# Patient Record
Sex: Female | Born: 1955 | Race: White | Hispanic: No | Smoking: Never smoker
Health system: Southern US, Community
[De-identification: ages and names within clinical notes are randomized; demographics above are authoritative.]

## PROBLEM LIST (undated history)

## (undated) DIAGNOSIS — E663 Overweight: Secondary | ICD-10-CM

## (undated) DIAGNOSIS — I10 Essential (primary) hypertension: Secondary | ICD-10-CM

## (undated) DIAGNOSIS — E785 Hyperlipidemia, unspecified: Secondary | ICD-10-CM

## (undated) DIAGNOSIS — M1990 Unspecified osteoarthritis, unspecified site: Secondary | ICD-10-CM

## (undated) DIAGNOSIS — M519 Unspecified thoracic, thoracolumbar and lumbosacral intervertebral disc disorder: Secondary | ICD-10-CM

## (undated) DIAGNOSIS — I7090 Unspecified atherosclerosis: Secondary | ICD-10-CM

## (undated) DIAGNOSIS — I509 Heart failure, unspecified: Secondary | ICD-10-CM

## (undated) DIAGNOSIS — R55 Syncope and collapse: Secondary | ICD-10-CM

## (undated) DIAGNOSIS — K219 Gastro-esophageal reflux disease without esophagitis: Secondary | ICD-10-CM

## (undated) HISTORY — DX: Heart failure, unspecified: I50.9

## (undated) HISTORY — PX: CARDIAC CATHETERIZATION: SHX172

## (undated) HISTORY — PX: ABDOMINAL HYSTERECTOMY: SHX81

---

## 2003-07-16 ENCOUNTER — Emergency Department (HOSPITAL_COMMUNITY): Admission: EM | Admit: 2003-07-16 | Discharge: 2003-07-17 | Payer: Self-pay | Admitting: Emergency Medicine

## 2003-07-17 ENCOUNTER — Encounter: Payer: Self-pay | Admitting: Emergency Medicine

## 2003-07-31 ENCOUNTER — Emergency Department (HOSPITAL_COMMUNITY): Admission: EM | Admit: 2003-07-31 | Discharge: 2003-07-31 | Payer: Self-pay | Admitting: Internal Medicine

## 2003-08-01 ENCOUNTER — Other Ambulatory Visit: Admission: RE | Admit: 2003-08-01 | Discharge: 2003-08-01 | Payer: Self-pay | Admitting: Gynecology

## 2003-09-25 ENCOUNTER — Inpatient Hospital Stay (HOSPITAL_COMMUNITY): Admission: RE | Admit: 2003-09-25 | Discharge: 2003-09-27 | Payer: Self-pay | Admitting: Gynecology

## 2003-09-25 ENCOUNTER — Encounter (INDEPENDENT_AMBULATORY_CARE_PROVIDER_SITE_OTHER): Payer: Self-pay | Admitting: Specialist

## 2003-12-04 ENCOUNTER — Inpatient Hospital Stay (HOSPITAL_COMMUNITY): Admission: EM | Admit: 2003-12-04 | Discharge: 2003-12-07 | Payer: Self-pay | Admitting: Emergency Medicine

## 2004-05-29 ENCOUNTER — Emergency Department (HOSPITAL_COMMUNITY): Admission: EM | Admit: 2004-05-29 | Discharge: 2004-05-29 | Payer: Self-pay | Admitting: Emergency Medicine

## 2005-09-01 ENCOUNTER — Ambulatory Visit: Payer: Self-pay | Admitting: Family Medicine

## 2005-09-04 ENCOUNTER — Ambulatory Visit: Payer: Self-pay | Admitting: Family Medicine

## 2005-09-10 ENCOUNTER — Ambulatory Visit: Payer: Self-pay | Admitting: *Deleted

## 2005-09-10 ENCOUNTER — Ambulatory Visit (HOSPITAL_COMMUNITY): Admission: RE | Admit: 2005-09-10 | Discharge: 2005-09-10 | Payer: Self-pay | Admitting: Family Medicine

## 2006-02-02 ENCOUNTER — Ambulatory Visit: Payer: Self-pay | Admitting: Family Medicine

## 2007-02-12 ENCOUNTER — Inpatient Hospital Stay (HOSPITAL_COMMUNITY): Admission: EM | Admit: 2007-02-12 | Discharge: 2007-02-13 | Payer: Self-pay | Admitting: Emergency Medicine

## 2007-02-21 ENCOUNTER — Ambulatory Visit: Payer: Self-pay | Admitting: Family Medicine

## 2007-04-04 ENCOUNTER — Ambulatory Visit: Payer: Self-pay | Admitting: Family Medicine

## 2008-01-25 ENCOUNTER — Ambulatory Visit: Payer: Self-pay | Admitting: Internal Medicine

## 2008-03-14 ENCOUNTER — Ambulatory Visit: Payer: Self-pay | Admitting: Internal Medicine

## 2008-05-01 ENCOUNTER — Ambulatory Visit: Payer: Self-pay | Admitting: Internal Medicine

## 2008-05-01 ENCOUNTER — Encounter (INDEPENDENT_AMBULATORY_CARE_PROVIDER_SITE_OTHER): Payer: Self-pay | Admitting: Nurse Practitioner

## 2008-05-01 LAB — CONVERTED CEMR LAB
ALT: 32 units/L (ref 0–35)
AST: 24 units/L (ref 0–37)
Albumin: 4.9 g/dL (ref 3.5–5.2)
Alkaline Phosphatase: 83 units/L (ref 39–117)
BUN: 14 mg/dL (ref 6–23)
Basophils Absolute: 0 10*3/uL (ref 0.0–0.1)
Basophils Relative: 0 % (ref 0–1)
CO2: 25 meq/L (ref 19–32)
Calcium: 9.6 mg/dL (ref 8.4–10.5)
Chloride: 100 meq/L (ref 96–112)
Creatinine, Ser: 0.76 mg/dL (ref 0.40–1.20)
Eosinophils Absolute: 0.1 10*3/uL (ref 0.0–0.7)
Eosinophils Relative: 1 % (ref 0–5)
Glucose, Bld: 74 mg/dL (ref 70–99)
HCT: 46.4 % — ABNORMAL HIGH (ref 36.0–46.0)
Hemoglobin: 14.7 g/dL (ref 12.0–15.0)
Lymphocytes Relative: 29 % (ref 12–46)
Lymphs Abs: 2.7 10*3/uL (ref 0.7–4.0)
MCHC: 31.7 g/dL (ref 30.0–36.0)
MCV: 90.3 fL (ref 78.0–100.0)
Monocytes Absolute: 0.7 10*3/uL (ref 0.1–1.0)
Monocytes Relative: 8 % (ref 3–12)
Neutro Abs: 5.9 10*3/uL (ref 1.7–7.7)
Neutrophils Relative %: 62 % (ref 43–77)
Platelets: 256 10*3/uL (ref 150–400)
Potassium: 4.3 meq/L (ref 3.5–5.3)
RBC: 5.14 M/uL — ABNORMAL HIGH (ref 3.87–5.11)
RDW: 13.9 % (ref 11.5–15.5)
Sodium: 138 meq/L (ref 135–145)
Total Bilirubin: 0.5 mg/dL (ref 0.3–1.2)
Total Protein: 8.5 g/dL — ABNORMAL HIGH (ref 6.0–8.3)
WBC: 9.5 10*3/uL (ref 4.0–10.5)

## 2008-05-02 ENCOUNTER — Encounter (INDEPENDENT_AMBULATORY_CARE_PROVIDER_SITE_OTHER): Payer: Self-pay | Admitting: Nurse Practitioner

## 2008-05-04 ENCOUNTER — Ambulatory Visit (HOSPITAL_COMMUNITY): Admission: RE | Admit: 2008-05-04 | Discharge: 2008-05-04 | Payer: Self-pay | Admitting: Family Medicine

## 2008-09-10 ENCOUNTER — Ambulatory Visit: Payer: Self-pay | Admitting: Internal Medicine

## 2008-09-10 LAB — CONVERTED CEMR LAB
BUN: 25 mg/dL — ABNORMAL HIGH (ref 6–23)
CO2: 21 meq/L (ref 19–32)
Calcium: 9.5 mg/dL (ref 8.4–10.5)
Chloride: 104 meq/L (ref 96–112)
Creatinine, Ser: 0.82 mg/dL (ref 0.40–1.20)
Glucose, Bld: 88 mg/dL (ref 70–99)
Potassium: 4.1 meq/L (ref 3.5–5.3)
Sed Rate: 11 mm/hr (ref 0–22)
Sodium: 142 meq/L (ref 135–145)

## 2009-03-29 ENCOUNTER — Ambulatory Visit: Payer: Self-pay | Admitting: Family Medicine

## 2009-04-09 ENCOUNTER — Ambulatory Visit (HOSPITAL_COMMUNITY): Admission: RE | Admit: 2009-04-09 | Discharge: 2009-04-09 | Payer: Self-pay | Admitting: Internal Medicine

## 2009-05-04 IMAGING — MG MM DIGITAL SCREENING BILAT
3 series · 3 of 3 positions shown · non-contrast
Comparison: none

DG SCREEN MAMMOGRAM BILATERAL
Bilateral CC and MLO view(s) were taken.

DIGITAL SCREENING MAMMOGRAM WITH CAD:
The breast tissue is heterogeneously dense.  No masses or malignant type calcifications are 
identified.  Compared with prior studies.

[R MLO]
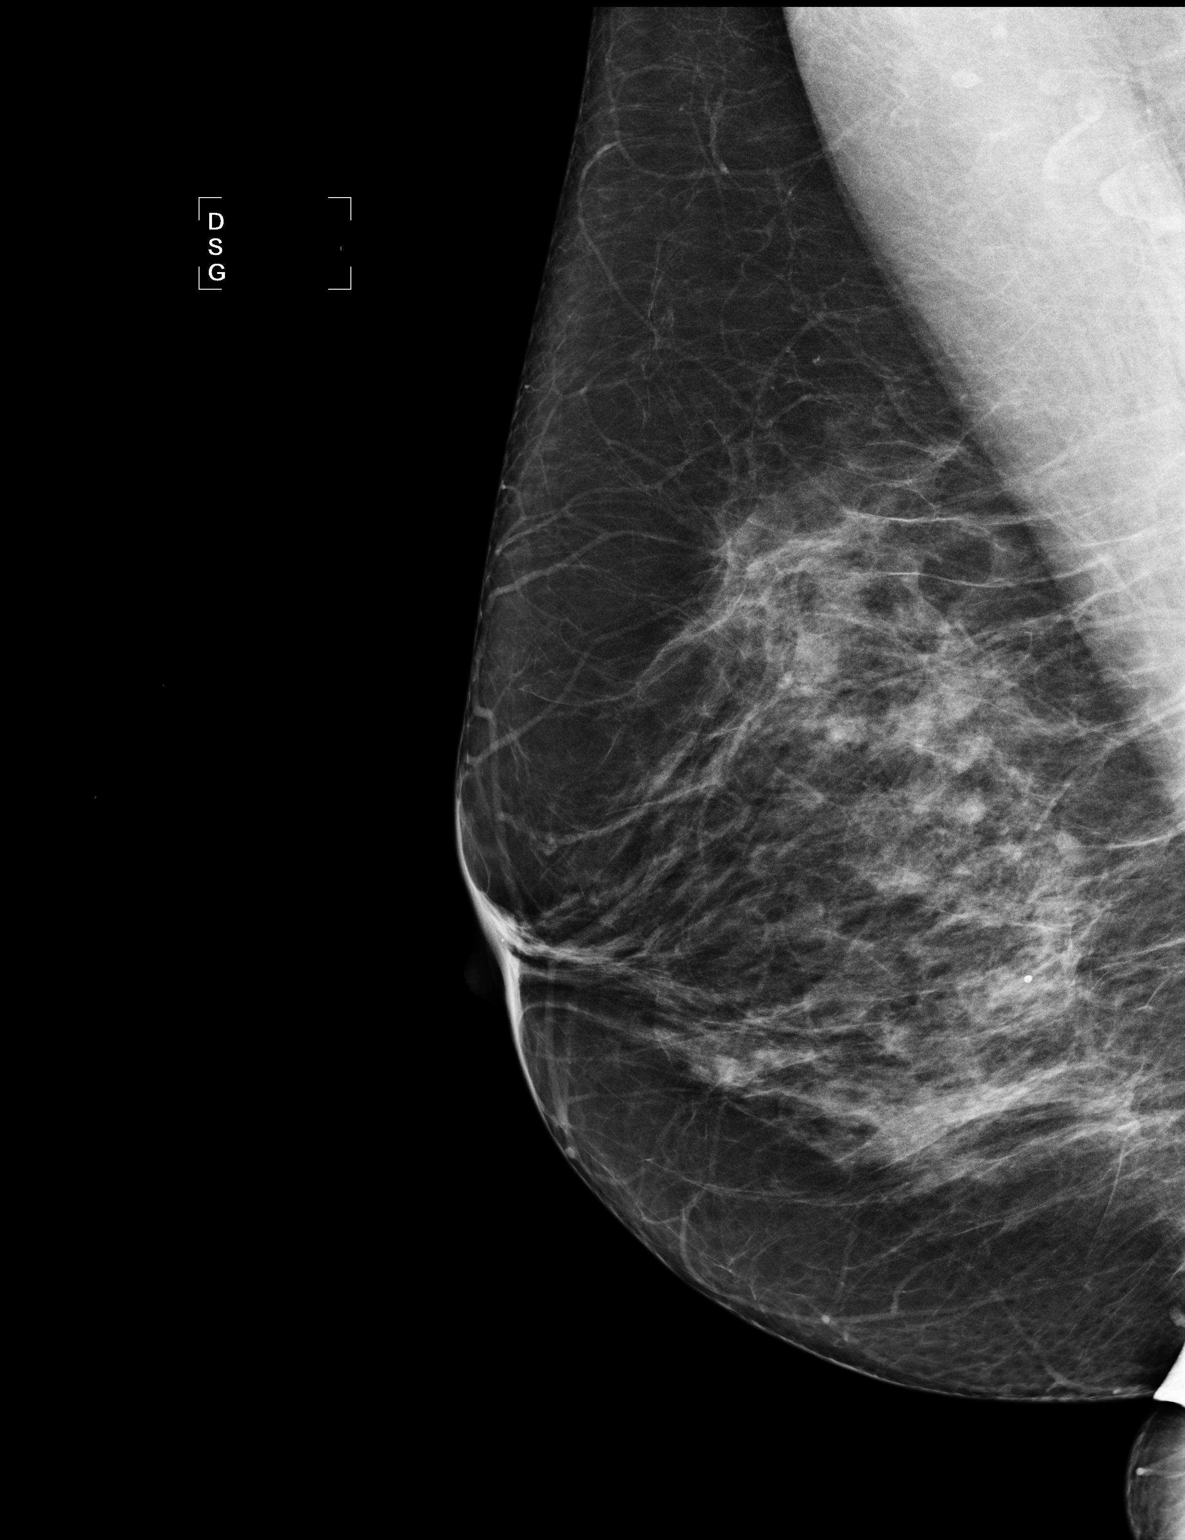

[L CC]
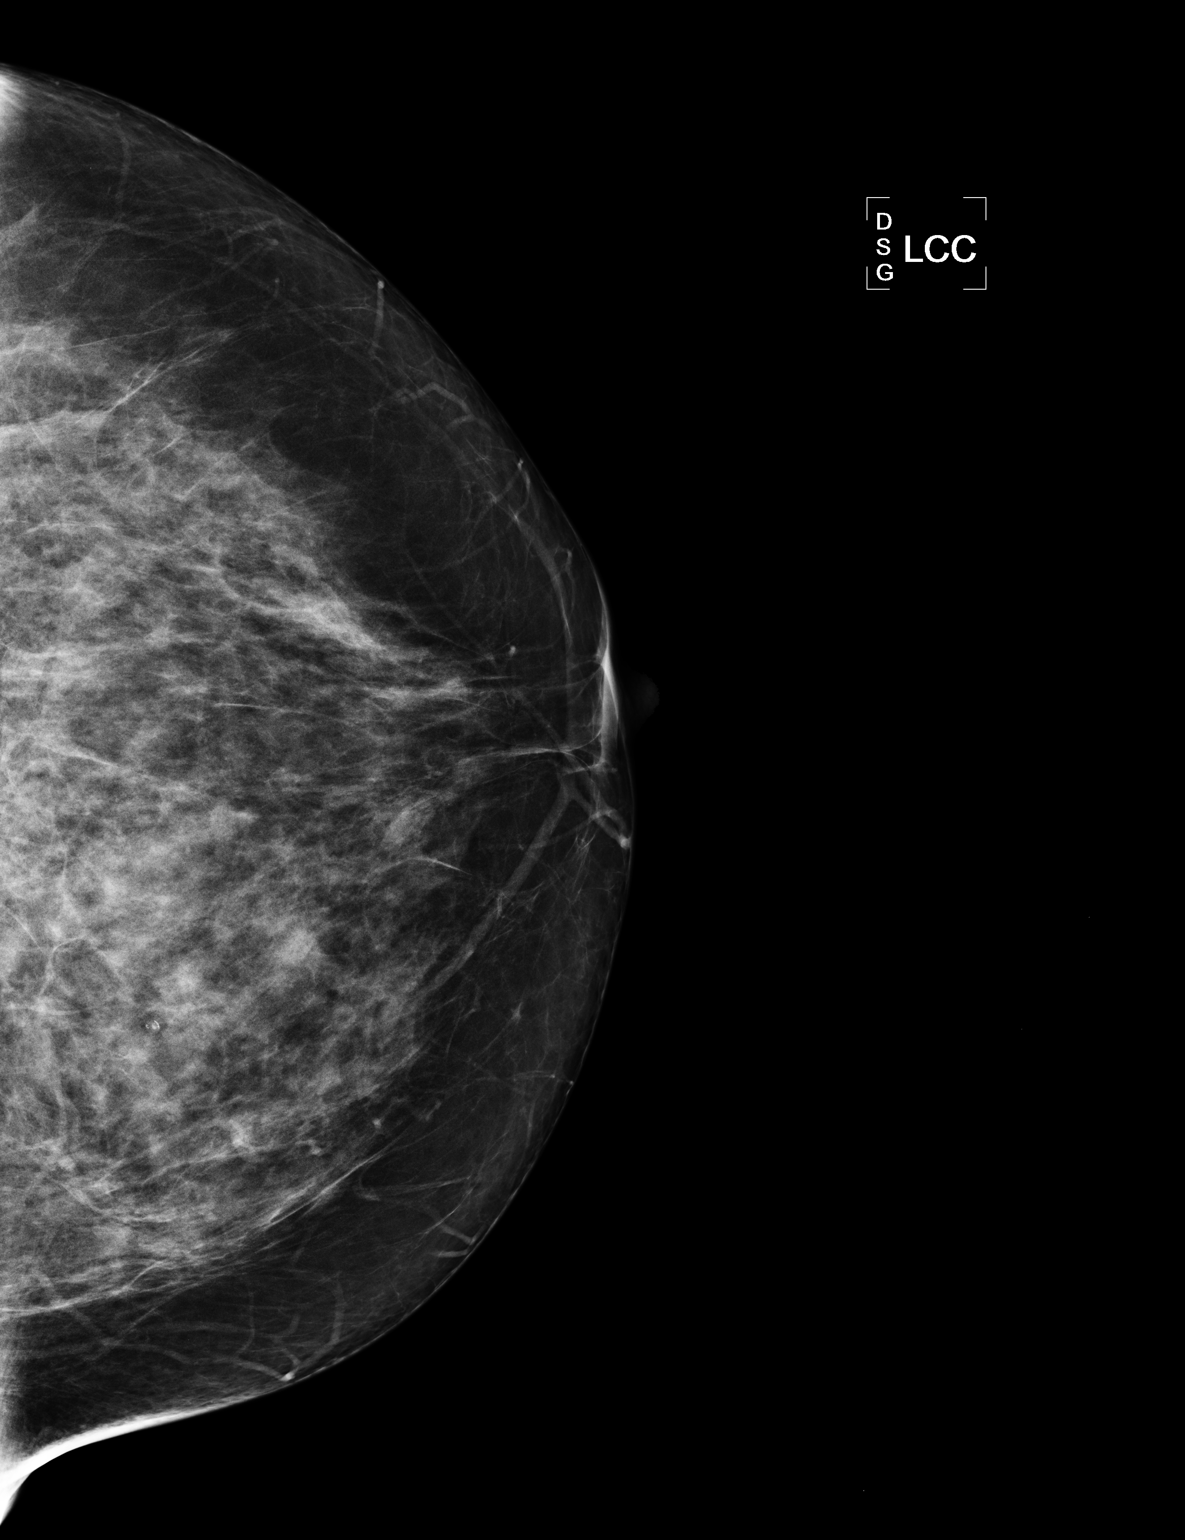

[L MLO]
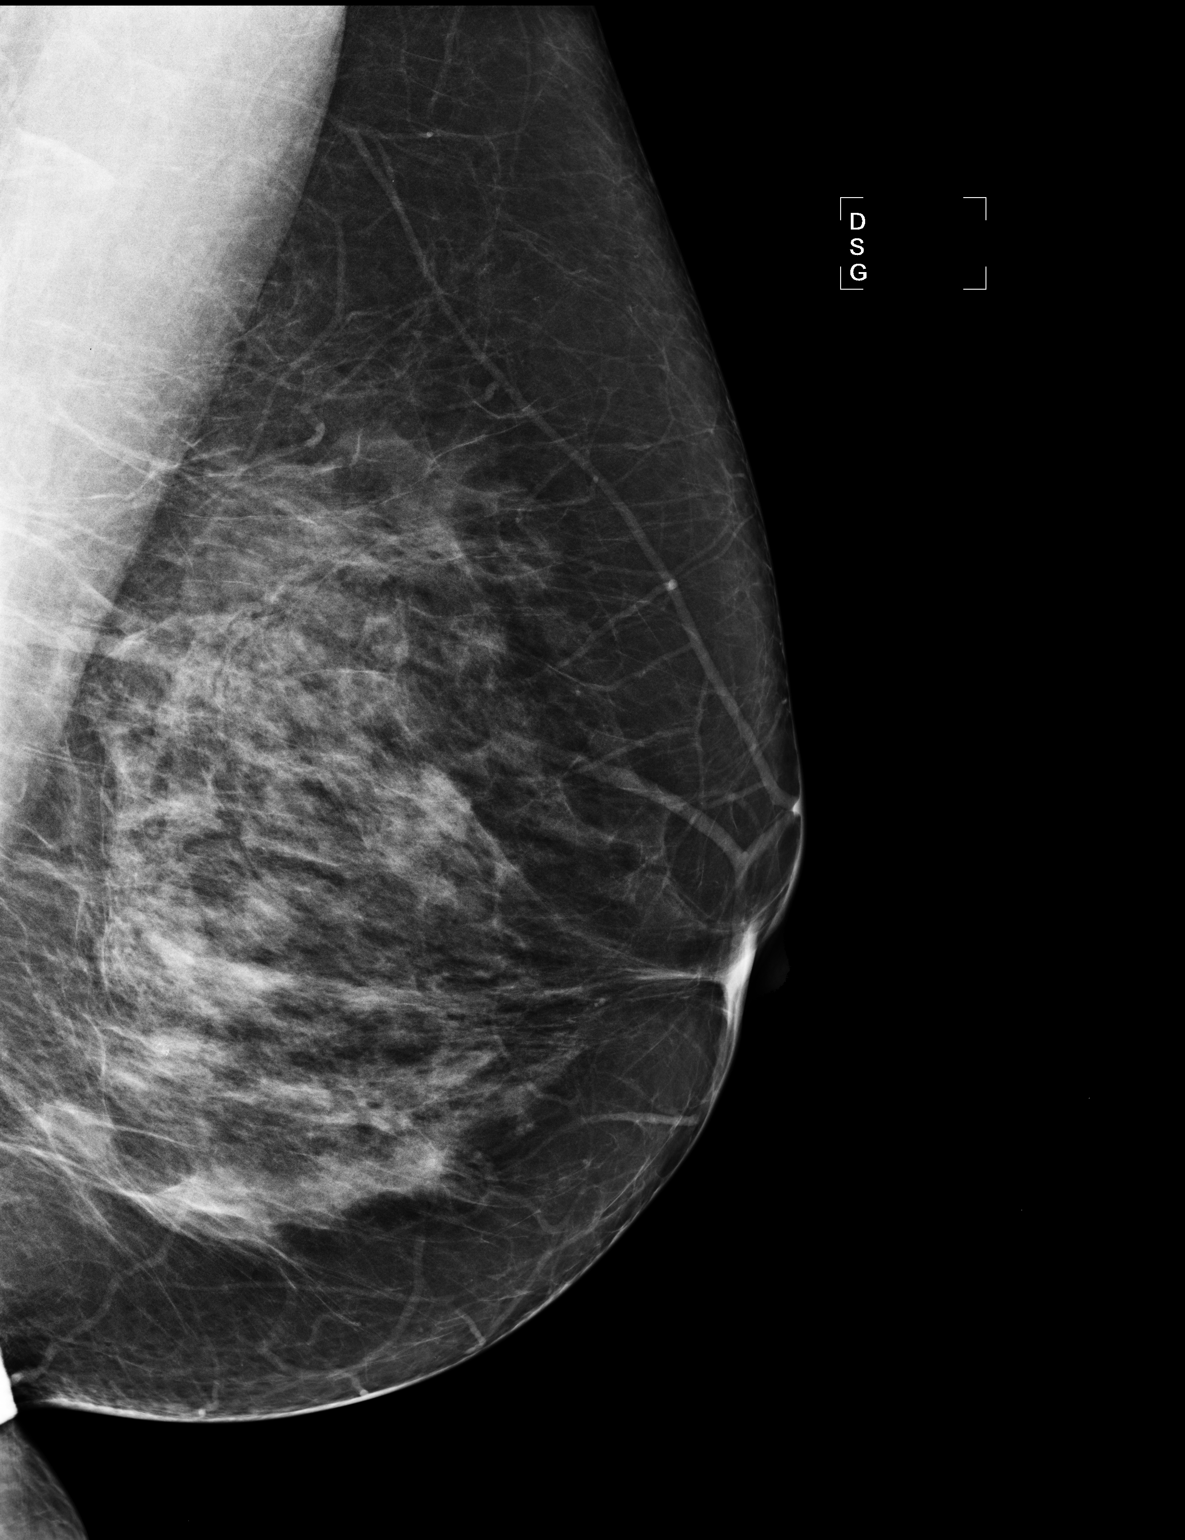

[3 of 3 positions shown; findings below may reference images not displayed]

IMPRESSION: No specific mammographic evidence of malignancy.  Next screening mammogram is recommended in one 
year.

ASSESSMENT: Negative - BI-RADS 1

Screening mammogram in 1 year.
ANALYZED BY COMPUTER AIDED DETECTION. , THIS PROCEDURE WAS A DIGITAL MAMMOGRAM.

## 2009-05-29 ENCOUNTER — Ambulatory Visit: Payer: Self-pay | Admitting: Internal Medicine

## 2009-05-29 LAB — CONVERTED CEMR LAB
ALT: 71 units/L — ABNORMAL HIGH (ref 0–35)
AST: 59 units/L — ABNORMAL HIGH (ref 0–37)
Albumin: 4.6 g/dL (ref 3.5–5.2)
Alkaline Phosphatase: 68 units/L (ref 39–117)
BUN: 16 mg/dL (ref 6–23)
Basophils Absolute: 0 10*3/uL (ref 0.0–0.1)
Basophils Relative: 1 % (ref 0–1)
CO2: 24 meq/L (ref 19–32)
Calcium: 10.1 mg/dL (ref 8.4–10.5)
Chloride: 104 meq/L (ref 96–112)
Creatinine, Ser: 0.87 mg/dL (ref 0.40–1.20)
Eosinophils Absolute: 0.2 10*3/uL (ref 0.0–0.7)
Eosinophils Relative: 3 % (ref 0–5)
Glucose, Bld: 110 mg/dL — ABNORMAL HIGH (ref 70–99)
HCT: 44.5 % (ref 36.0–46.0)
Hemoglobin: 14.2 g/dL (ref 12.0–15.0)
Lymphocytes Relative: 43 % (ref 12–46)
Lymphs Abs: 2.5 10*3/uL (ref 0.7–4.0)
MCHC: 31.9 g/dL (ref 30.0–36.0)
MCV: 87.4 fL (ref 78.0–100.0)
Monocytes Absolute: 0.4 10*3/uL (ref 0.1–1.0)
Monocytes Relative: 7 % (ref 3–12)
Neutro Abs: 2.7 10*3/uL (ref 1.7–7.7)
Neutrophils Relative %: 47 % (ref 43–77)
Platelets: 214 10*3/uL (ref 150–400)
Potassium: 4.6 meq/L (ref 3.5–5.3)
RBC: 5.09 M/uL (ref 3.87–5.11)
RDW: 13.3 % (ref 11.5–15.5)
Sodium: 139 meq/L (ref 135–145)
TSH: 4.982 microintl units/mL — ABNORMAL HIGH (ref 0.350–4.500)
Total Bilirubin: 0.5 mg/dL (ref 0.3–1.2)
Total Protein: 7.7 g/dL (ref 6.0–8.3)
Vit D, 25-Hydroxy: 29 ng/mL — ABNORMAL LOW (ref 30–89)
WBC: 5.8 10*3/uL (ref 4.0–10.5)

## 2009-06-21 ENCOUNTER — Ambulatory Visit: Payer: Self-pay | Admitting: Internal Medicine

## 2009-10-01 ENCOUNTER — Encounter (INDEPENDENT_AMBULATORY_CARE_PROVIDER_SITE_OTHER): Payer: Self-pay | Admitting: Adult Health

## 2009-10-01 ENCOUNTER — Ambulatory Visit: Payer: Self-pay | Admitting: Family Medicine

## 2009-10-01 LAB — CONVERTED CEMR LAB
ALT: 66 units/L — ABNORMAL HIGH (ref 0–35)
AST: 63 units/L — ABNORMAL HIGH (ref 0–37)
Albumin: 4.5 g/dL (ref 3.5–5.2)
Alkaline Phosphatase: 69 units/L (ref 39–117)
Amphetamine Screen, Ur: NEGATIVE
BUN: 17 mg/dL (ref 6–23)
Barbiturate Quant, Ur: NEGATIVE
Benzodiazepines.: NEGATIVE
CO2: 25 meq/L (ref 19–32)
Calcium: 9.6 mg/dL (ref 8.4–10.5)
Chloride: 106 meq/L (ref 96–112)
Cocaine Metabolites: NEGATIVE
Creatinine, Ser: 0.78 mg/dL (ref 0.40–1.20)
Creatinine,U: 127.4 mg/dL
Glucose, Bld: 91 mg/dL (ref 70–99)
Marijuana Metabolite: NEGATIVE
Methadone: NEGATIVE
Microalb, Ur: 0.97 mg/dL (ref 0.00–1.89)
Opiate Screen, Urine: NEGATIVE
Phencyclidine (PCP): NEGATIVE
Potassium: 4 meq/L (ref 3.5–5.3)
Propoxyphene: NEGATIVE
Sodium: 142 meq/L (ref 135–145)
T3 Uptake Ratio: 29.1 % (ref 22.5–37.0)
T4, Total: 6.9 ug/dL (ref 5.0–12.5)
TSH: 3.04 microintl units/mL (ref 0.350–4.500)
Total Bilirubin: 0.3 mg/dL (ref 0.3–1.2)
Total Protein: 7.2 g/dL (ref 6.0–8.3)

## 2009-10-03 ENCOUNTER — Ambulatory Visit (HOSPITAL_COMMUNITY): Admission: RE | Admit: 2009-10-03 | Discharge: 2009-10-03 | Payer: Self-pay | Admitting: Internal Medicine

## 2009-10-03 ENCOUNTER — Encounter (INDEPENDENT_AMBULATORY_CARE_PROVIDER_SITE_OTHER): Payer: Self-pay | Admitting: Adult Health

## 2009-10-03 LAB — CONVERTED CEMR LAB
HCV Ab: NEGATIVE
Hep A IgM: NEGATIVE
Hep B C IgM: NEGATIVE
Hepatitis B Surface Ag: NEGATIVE

## 2009-10-16 ENCOUNTER — Encounter: Admission: RE | Admit: 2009-10-16 | Discharge: 2009-11-13 | Payer: Self-pay | Admitting: Orthopedic Surgery

## 2009-10-22 ENCOUNTER — Ambulatory Visit: Payer: Self-pay | Admitting: Internal Medicine

## 2009-10-22 ENCOUNTER — Encounter (INDEPENDENT_AMBULATORY_CARE_PROVIDER_SITE_OTHER): Payer: Self-pay | Admitting: Adult Health

## 2009-10-22 LAB — CONVERTED CEMR LAB
Cholesterol: 183 mg/dL (ref 0–200)
HDL: 43 mg/dL (ref >39)
LDL Cholesterol: 118 mg/dL — ABNORMAL HIGH (ref 0–99)
Total CHOL/HDL Ratio: 4.3
Triglycerides: 111 mg/dL (ref <150)
VLDL: 22 mg/dL (ref 0–40)

## 2009-12-31 ENCOUNTER — Ambulatory Visit: Payer: Self-pay | Admitting: Internal Medicine

## 2009-12-31 ENCOUNTER — Encounter (INDEPENDENT_AMBULATORY_CARE_PROVIDER_SITE_OTHER): Payer: Self-pay | Admitting: Adult Health

## 2009-12-31 LAB — CONVERTED CEMR LAB
ALT: 53 units/L — ABNORMAL HIGH (ref 0–35)
AST: 44 units/L — ABNORMAL HIGH (ref 0–37)
Albumin: 4.5 g/dL (ref 3.5–5.2)
Alkaline Phosphatase: 80 units/L (ref 39–117)
BUN: 9 mg/dL (ref 6–23)
CO2: 28 meq/L (ref 19–32)
Calcium: 9.4 mg/dL (ref 8.4–10.5)
Chloride: 104 meq/L (ref 96–112)
Cholesterol: 154 mg/dL (ref 0–200)
Creatinine, Ser: 0.77 mg/dL (ref 0.40–1.20)
Glucose, Bld: 104 mg/dL — ABNORMAL HIGH (ref 70–99)
HDL: 41 mg/dL (ref >39)
LDL Cholesterol: 91 mg/dL (ref 0–99)
Potassium: 4.7 meq/L (ref 3.5–5.3)
Sodium: 142 meq/L (ref 135–145)
Total Bilirubin: 0.3 mg/dL (ref 0.3–1.2)
Total CHOL/HDL Ratio: 3.8
Total Protein: 7.3 g/dL (ref 6.0–8.3)
Triglycerides: 112 mg/dL (ref <150)
VLDL: 22 mg/dL (ref 0–40)

## 2010-04-17 ENCOUNTER — Ambulatory Visit: Payer: Self-pay | Admitting: Internal Medicine

## 2010-06-19 ENCOUNTER — Encounter (INDEPENDENT_AMBULATORY_CARE_PROVIDER_SITE_OTHER): Payer: Self-pay | Admitting: Adult Health

## 2010-06-19 ENCOUNTER — Ambulatory Visit: Payer: Self-pay | Admitting: Internal Medicine

## 2010-06-19 LAB — CONVERTED CEMR LAB
ALT: 20 units/L (ref 0–35)
AST: 29 units/L (ref 0–37)
Albumin: 4.6 g/dL (ref 3.5–5.2)
Alkaline Phosphatase: 61 units/L (ref 39–117)
BUN: 13 mg/dL (ref 6–23)
CO2: 26 meq/L (ref 19–32)
Calcium: 9.8 mg/dL (ref 8.4–10.5)
Chloride: 104 meq/L (ref 96–112)
Cholesterol: 102 mg/dL (ref 0–200)
Creatinine, Ser: 0.91 mg/dL (ref 0.40–1.20)
Glucose, Bld: 109 mg/dL — ABNORMAL HIGH (ref 70–99)
HDL: 47 mg/dL (ref >39)
LDL Cholesterol: 44 mg/dL (ref 0–99)
Potassium: 4.4 meq/L (ref 3.5–5.3)
Sodium: 139 meq/L (ref 135–145)
Total Bilirubin: 0.4 mg/dL (ref 0.3–1.2)
Total CHOL/HDL Ratio: 2.2
Total Protein: 7.2 g/dL (ref 6.0–8.3)
Triglycerides: 55 mg/dL (ref <150)
VLDL: 11 mg/dL (ref 0–40)

## 2010-08-04 ENCOUNTER — Ambulatory Visit: Payer: Self-pay | Admitting: Family Medicine

## 2011-03-04 ENCOUNTER — Encounter (INDEPENDENT_AMBULATORY_CARE_PROVIDER_SITE_OTHER): Payer: Self-pay | Admitting: *Deleted

## 2011-03-04 LAB — CONVERTED CEMR LAB
ALT: 60 units/L — ABNORMAL HIGH (ref 0–35)
AST: 59 units/L — ABNORMAL HIGH (ref 0–37)
Albumin: 5.1 g/dL (ref 3.5–5.2)
Alkaline Phosphatase: 79 units/L (ref 39–117)
BUN: 13 mg/dL (ref 6–23)
Basophils Absolute: 0 10*3/uL (ref 0.0–0.1)
Basophils Relative: 0 % (ref 0–1)
CO2: 25 meq/L (ref 19–32)
Calcium: 10.2 mg/dL (ref 8.4–10.5)
Chlamydia, DNA Probe: NEGATIVE
Chloride: 100 meq/L (ref 96–112)
Creatinine, Ser: 0.81 mg/dL (ref 0.40–1.20)
Eosinophils Absolute: 0.4 10*3/uL (ref 0.0–0.7)
Eosinophils Relative: 4 % (ref 0–5)
GC Probe Amp, Genital: NEGATIVE
Glucose, Bld: 92 mg/dL (ref 70–99)
HCT: 45.7 % (ref 36.0–46.0)
Hemoglobin: 14.8 g/dL (ref 12.0–15.0)
Lymphocytes Relative: 40 % (ref 12–46)
Lymphs Abs: 4.1 10*3/uL — ABNORMAL HIGH (ref 0.7–4.0)
MCHC: 32.4 g/dL (ref 30.0–36.0)
MCV: 87.4 fL (ref 78.0–100.0)
Monocytes Absolute: 0.9 10*3/uL (ref 0.1–1.0)
Monocytes Relative: 8 % (ref 3–12)
Neutro Abs: 4.8 10*3/uL (ref 1.7–7.7)
Neutrophils Relative %: 47 % (ref 43–77)
Platelets: 223 10*3/uL (ref 150–400)
Potassium: 4.2 meq/L (ref 3.5–5.3)
RBC: 5.23 M/uL — ABNORMAL HIGH (ref 3.87–5.11)
RDW: 13.9 % (ref 11.5–15.5)
Sodium: 138 meq/L (ref 135–145)
Total Bilirubin: 0.5 mg/dL (ref 0.3–1.2)
Total Protein: 8.4 g/dL — ABNORMAL HIGH (ref 6.0–8.3)
WBC: 10.2 10*3/uL (ref 4.0–10.5)

## 2011-03-10 ENCOUNTER — Other Ambulatory Visit (HOSPITAL_COMMUNITY): Payer: Self-pay | Admitting: Family Medicine

## 2011-03-10 DIAGNOSIS — R1032 Left lower quadrant pain: Secondary | ICD-10-CM

## 2011-03-11 ENCOUNTER — Encounter (INDEPENDENT_AMBULATORY_CARE_PROVIDER_SITE_OTHER): Payer: Self-pay | Admitting: *Deleted

## 2011-03-11 LAB — CONVERTED CEMR LAB
Cholesterol: 128 mg/dL (ref 0–200)
HDL: 42 mg/dL (ref >39)
LDL Cholesterol: 69 mg/dL (ref 0–99)
Total CHOL/HDL Ratio: 3
Triglycerides: 85 mg/dL (ref <150)
VLDL: 17 mg/dL (ref 0–40)

## 2011-03-13 ENCOUNTER — Ambulatory Visit (HOSPITAL_COMMUNITY)
Admission: RE | Admit: 2011-03-13 | Discharge: 2011-03-13 | Disposition: A | Discharge status: Home / Self Care | Payer: Medicare Other | Source: Ambulatory Visit | Attending: Family Medicine | Admitting: Family Medicine

## 2011-03-13 DIAGNOSIS — R1032 Left lower quadrant pain: Secondary | ICD-10-CM

## 2011-03-13 DIAGNOSIS — K7689 Other specified diseases of liver: Secondary | ICD-10-CM | POA: Insufficient documentation

## 2011-03-13 DIAGNOSIS — R1011 Right upper quadrant pain: Secondary | ICD-10-CM | POA: Insufficient documentation

## 2011-03-13 MED ORDER — IOHEXOL 300 MG/ML  SOLN
100.0000 mL | Freq: Once | INTRAMUSCULAR | Status: AC | PRN
  Administered 2011-03-13: 100 mL via INTRAVENOUS

## 2011-04-10 ENCOUNTER — Other Ambulatory Visit (HOSPITAL_COMMUNITY): Payer: Self-pay | Admitting: Family Medicine

## 2011-04-10 DIAGNOSIS — Z1231 Encounter for screening mammogram for malignant neoplasm of breast: Secondary | ICD-10-CM

## 2011-04-28 ENCOUNTER — Ambulatory Visit (HOSPITAL_COMMUNITY): Payer: Medicaid Other

## 2011-05-01 NOTE — Discharge Summary (Signed)
   NAME:  Cheryl Chase, Cheryl Chase                            ACCOUNT NO.:  [id number]   MEDICAL RECORD NO.:  [id number]                   PATIENT TYPE:  INP   LOCATION:  9323                                 FACILITY:  [facility]   PHYSICIAN:  Timothy P. Fontaine, M.D.           DATE OF BIRTH:  05-22-1956   DATE OF ADMISSION:  09/25/2003  DATE OF DISCHARGE:  09/27/2003                                 DISCHARGE SUMMARY   DISCHARGE DIAGNOSES:  1. Menorrhagia.  2. Anemia.  3. Leiomyomata.  4. Status post abdominal hysterectomy with bilateral salpingo-oophorectomy     by Dr. Colin Broach on September 25, 2003.   HISTORY:  [age]-year-age female, G4, P4, with a history of tubal  ligation with increasing irregular menses with daily bleeding up to  hemorrhage status.  Uterus was 16-week size and ultrasound revealed multiple  leiomyomas, largest ranging 6-7 mm.  _____ was performed, there were no  gross intracavitary abnormality.  Hemoglobin was 10.  TSH, FSH, and  prolactin all within normal limits.  Was started on high-dose progestin  therapy, however, continued to bleed off-and-on on a daily basis.  Patient  is admitted for definitive surgery.   HOSPITAL COURSE:  On September 25, 2003, the patient was admitted and  underwent a total abdominal hysterectomy and bilateral salpingo-oophorectomy  by Dr. Colin Broach without complication.  Postoperatively patient  remained afebrile, voiding, stable condition.  She was discharged to home on  September 27, 2003, in satisfactory condition, and given [facility]  instructions about discharge planning.  On September 26, 2003, hemoglobin was  8.5 and hematocrit 25.2.   DISCHARGE INSTRUCTIONS:  The patient was discharged to home.  She was to  return to the office in two weeks.  If she has any problems prior to that  contact the office.  She was given a prescription for Tylox, #20, p.r.n.  pain and is to take iron sulfate one p.o. every day.     Susa Loffler, P.A.                    Timothy P. Audie Box, M.D.    Ardath Sax  D:  10/12/2003  T:  10/12/2003  Job:  [id number]

## 2011-05-01 NOTE — H&P (Signed)
Cheryl Chase, Cheryl Chase                  ACCOUNT NO.:  [id number]   MEDICAL RECORD NO.:  [id number]          PATIENT TYPE:  EMS   LOCATION:  MAJO                         FACILITY:  [facility]   PHYSICIAN:  Manning Charity, MD     DATE OF BIRTH:  March 04, 1956   DATE OF ADMISSION:  02/12/2007  DATE OF DISCHARGE:                              HISTORY & PHYSICAL   PRIMARY CARE PHYSICIAN:  Willis Modena, MD   CHIEF COMPLAINT:  Substernal chest pain and syncopal episode.   HISTORY OF PRESENT ILLNESS:  Cheryl Chase is a 55 year old woman with a  history of polysubstance abuse and reported syncopal episodes in 2005 of  an unknown etiology, who presents to the emergency room because of  sudden onset of sharp substernal chest pressure which began around 6  p.m. tonight, per her report.  She says that there was no radiation of  the pain, no associated symptoms or shortness of breath, nausea,  vomiting or diaphoresis.  The pain lasted for about 6 hours and she then  had a syncopal episode which she reports lasted for a few minutes.  Her  sister witnessed this syncopal episode and says that she had tonic-  clonic activity of her feet and hands, although her sister is not  available to get a history from at this time.  The patient denies any  preceding symptoms before the syncopal episode, other than the chest  pain.  She specifically denies any palpitations.  She did not have any  incontinence or tongue-biting during the episode.  She does report that  things seemed a little off when she came out of the episode and it  took a while before everything seemed to return to normal.  Per her  report, she has no history of seizures, strokes or head trauma.   PAST MEDICAL HISTORY:  1. She has had a total abdominal hysterectomy with bilateral salpingo-      oophorectomy for menorrhagia.  2. She has a history of polysubstance abuse with crack cocaine and      alcohol.  3. She has a history of recurrent UTIs as a child.  4. She has a reported history of multiple syncopal episodes, with the      last one being in 2005, although there is no record of this in the      computer chart.   HOME MEDICATIONS:  She takes Advil p.r.n. and denies taking any other  medications.   ALLERGIES:  She has no known drug allergies.   FAMILY HISTORY:  Her mother is deceased from bone cancer.  Father is  deceased from hypertension and an MI at the age of 47.  She has 8  siblings with heart trouble, but she does not know at what age they  started having heart trouble and she does not know the specifics  regarding the heart trouble.   SOCIAL HISTORY:  She is divorced with 4 children, unemployed, formerly  worked at [employer] per her report to me.  She says that she drinks  alcohol once in a while, cannot  remember the last time she had any  alcohol.  She specifically denies any history of alcohol abuse, although  it is written in her chart.  She continues to use snuff regularly and  denies any illegal drug use, although there are records in the computer,  history and physicals, which note cocaine use.   REVIEW OF SYSTEMS:  Positive for dyspnea on exertion, otherwise  negative, except as per the HPI.   PHYSICAL EXAMINATION:  VITAL SIGNS:  Temperature of 98.2, blood pressure  120/76, pulse is 70, respiratory rate is 18 and 96% on room air.  GENERAL:  She was slightly lethargic, in no acute distress.  She appears  older than her stated age.  HEENT:  Atraumatic with very poor dentition and clear mucous membranes.  CARDIOVASCULAR:  Regular rate and rhythm except for an occasional  premature beat.  She had no murmurs, rubs, or gallops.  She had clear,  good air movement.  ABDOMEN:  Soft, protuberant with positive bowel sounds and nontender.  EXTREMITIES:  Without any edema.  NEUROLOGIC:  Cranial nerves II-XII intact.  Sensation was normal.  Strength was 5/5 throughout.  Deep tendon reflexes were 3+, but  symmetric and no  clonus.   LABORATORY DATA:  White count 9.2, hemoglobin 13.3, platelet count  237,000.  BMET showed a sodium of 139, potassium 3.7, chloride 106,  bicarbonate 26, BUN 9, creatinine 0.8, glucose 126.  Cardiac enzymes:  Troponin I initially was less than 0.05 and then elevated to 0.11, CK-MB  was 1.4 to 2.6 and myoglobin was negative x2.   IMAGING:  CT of the chest with contrast was negative for PE; it did show  some atelectasis and fatty liver.   ASSESSMENT:  1. Chest pain.  Symptoms of substernal severe chest pain is of course      concerning for coronary etiology.  Currently, her cardiac risk      factors include possible positive family history and cocaine use as      well as postmenopausal state and age.  Other differential includes      a gastrointestinal course, especially if she is continuing to      drink.  I am specifically concerned about gastritis or esophageal      spasms.  At this time, we will proceed with ruling acute myocardial      infarction with cardiac enzymes and serial EKGs.  If negative, we      will need to decide on Cardiolite versus outpatient      catheterization.  We will monitor on telemetry, especially given      the syncopal episode.  2. Syncopal episode.  Timing with the chest pain is concerning for      hypoxic episode or arrhythmia.  Tonic-clonic activity may represent      a seizure activity related to hypoxia or arrhythmia.  We will      monitor on telemetry, rule out with cardiac enzymes and serial      EKGs.  I am also going to go ahead and check an MRI of the head,      given the fact that she has no known foci for seizures and the      details around the syncopal episode are somewhat unclear.  I am      also going to check an EEG, although postictal EEG is of limited      value usually.  3. History of polysubstance abuse.  We will treat with thiamine  and     folate, monitor for delirium tremens and check an alcohol as well      as a urine drug  screen.  4. Fatty liver.  We will check liver function tests and coagulations.      Manning Charity, MD     KK/MEDQ  D:  02/12/2007  T:  02/12/2007  Job:  [id number]   cc:   Willis Modena MD

## 2011-05-01 NOTE — H&P (Signed)
NAME:  Cheryl Chase, Cheryl Chase                            ACCOUNT NO.:  [id number]   MEDICAL RECORD NO.:  [id number]                   PATIENT TYPE:  INP   LOCATION:  5007                                 FACILITY:  [facility]   PHYSICIAN:  Vania Rea, M.D.              DATE OF BIRTH:  [birth year]   DATE OF ADMISSION:  12/03/2003  DATE OF DISCHARGE:                                HISTORY & PHYSICAL   PRIMARY CARE PHYSICIAN:  Unassigned.   CHIEF COMPLAINT:  Lower abdominal pain for two days.   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 The patient notes  increased thirst but not increased frequency of micturition or dysuria. The  patient had a syncopal episode one day ago related to standing but denies  nausea, vomiting, or diarrhea. No constipation. Has not noticed hematuria.   The patient had noted that patient has worsened with intercourse, and she  has post coital spotting. The patient saw her gynecologist on the day of  admission, and he noted ragged vaginal cuff. There was no note of any  intravaginal foreign body when he examined her. In the emergency room, she  was evaluated by the surgical service. They reviewed her abdominal CT and  confirmed that there was no evidence of acute intra-abdominal pathology,  particularly no evidence of intravaginal fistula. High density material seen  in the vagina is considered to probably a suture line.   PAST MEDICAL HISTORY:  Significant for recurrent bladder and kidney  infections as a teenager. She is status post TAH and BSO September 25, 2003.  Denies history of  kidney stones.   MEDICATIONS:  None.   ALLERGIES:  No known drug allergies.   SOCIAL HISTORY:  She works as a Advertising copywriter. Denies smoking but uses a small  box of snuff daily for over 40 years. Smokes crack cocaine occasionally,  last use two weeks ago. Denies heroin or other illicit drug use. Denies ETOH  abuse as she uses alcohol only very occasionally.   REVIEW OF SYSTEMS:  Not further contributory.   PHYSICAL EXAMINATION:  GENERAL:  Thin and frail looking middle-aged  Caucasian lady lying on the stretcher.  VITAL SIGNS:  Temperature is 100.7, pulse is 70, blood pressure varies  between 96/66 to 110/68, respirations 20. She is saturating 100% on 2  liters.  HEENT:  She is pink and anicteric. Pupils are equal and reactive to light.  CHEST:  Her chest is clear bilaterally. There is no wheezing.  CARDIOVASCULAR:  Cardiovascular system is  regular. There is no murmur.  ABDOMEN:  She has redness in the lower abdominal wall which relates to the  use of a heating pad to relieve the pain. She is diffusely tender in the  suprapubic area. There is mild epigastric tenderness, and she is tender in  the left flank.  EXTREMITIES:  There is no edema and dorsalis pedis is 2+ bilaterally.   LABORATORY DATA:  Hemoglobin 11.4, hematocrit 36.4, white count 14.7, MCV  79.3. Sodium 139, potassium 3.4, BUN 12, and creatinine 1. Her pH is 7.4.  Urine pregnancy test is negative. Urinalysis shows pH of 6, leukocyte  esterase moderate, urine is cloudy, ketones 15, protein is 100, small amount  of blood, white cells 21 to 50, red cells 7 to 10. CT scan of the abdomen as  reported negative for intravaginal fistula, negative for any acute intra-  abdominal pathology.   ASSESSMENT:  Probable left pyelonephritis. Foreign body in the vagina is  unlikely since she had vaginal exam by her gynecologist today, and she also  denies any vaginal discharge. The patient is also dehydrated and is probably  contributory  to syncope.   PLAN:  IV fluids. Add Cipro as antibiotic. Await culture results. I think we  should get an ultrasound of renal collecting system.                                                Vania Rea, M.D.    LC/MEDQ  D:  12/04/2003  T:  12/04/2003  Job:  [id number]

## 2011-05-01 NOTE — Discharge Summary (Signed)
NAMECIRIA, Cheryl Chase                  ACCOUNT NO.:  [id number]   MEDICAL RECORD NO.:  [id number]          PATIENT TYPE:  INP   LOCATION:  3729                         FACILITY:  [facility]   PHYSICIAN:  Wilson Singer, M.D.DATE OF BIRTH:  April 07, 1956   DATE OF ADMISSION:  02/12/2007  DATE OF DISCHARGE:  02/13/2007                               DISCHARGE SUMMARY   FINAL DISCHARGE DIAGNOSES:  1. Musculoskeletal chest pain.  2. Syncopal, probably vasovagal in origin.   CONDITION ON DISCHARGE:  Stable.   MEDICATIONS ON DISCHARGE:  Ibuprofen 800 mg t.i.d. with food for 5 days.   PROCEDURES:  1. Inpatient MRI of the brain which was normal.  2. CT angiogram of the chest which showed some bibasilar dependent      atelectasis, but no evidence of acute pulmonary embolism.  3. Chest x-ray done on February 11, 2007 which showed mild enlargement      of cardiomediastinal silhouette, no edema and bibasilar      atelectasis.   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  The pain  lasted 6 hours and she became lightheaded and lost consciousness for  only about 1 minute.  Apparently she was shaking, lost unconscious, but  there was no tongue biting or urinary incontinence.  Please see initial  History and Physical examination performed by Dr. Lyda Perone.   HOSPITAL PROGRESS:  The patient was admitted to the telemetry floor and  serial cardiac enzymes and electrocardiograms were done which were all  without any changes.  Also an MRI brain scan was done which was  essentially normal.  During hospitalization she was given ibuprofen 800  mg t.i.d. and on the day of discharge the pain is resolved.  On physical  examination she is afebrile.  Blood pressure 100/64, temperature 97.9,  pulse 50.  Pulse ox  99%.  Heart sounds are present and normal.  Lung  fields are clear.  There is no pleural rub.  There is no pericardial  rub.  She is being discharged and I have given her a prescription for  ibuprofen 800 mg t.i.d. with food for the next 5 days.  She should  follow up with her primary care physician in the next week or two.      Wilson Singer, M.D.  Electronically Signed     NCG/MEDQ  D:  02/13/2007  T:  02/13/2007  Job:  [id number]

## 2011-05-01 NOTE — Consult Note (Signed)
NAME:  Cheryl Chase, Cheryl Chase                            ACCOUNT NO.:  [id number]   MEDICAL RECORD NO.:  [id number]                   PATIENT TYPE:  INP   LOCATION:  5007                                 FACILITY:  [facility]   PHYSICIAN:  Adolph Pollack, M.D.            DATE OF BIRTH:  1956-11-06   DATE OF CONSULTATION:  12/03/2003  DATE OF DISCHARGE:                                   CONSULTATION   REASON FOR CONSULTATION:  Lower abdominal pain, possible enterovaginal  fistula.   HISTORY OF PRESENT ILLNESS:  Cheryl Chase is a 55 year old female who was two  months status post TAH/BSO.  Saturday night she began noticing some sharp  suprapubic pain that began to escalate.  She also had some urinary  frequency, urgency and burning with urination.  Sunday night she had sexual  intercourse and had increased pain.  By her report, she presented to her  gynecologist's office and was examined and was noted to have a little bit of  a shaggy vaginal cuff.  Because of increased pain, she presented to the  emergency department.  While in the emergency department, she was evaluated  and noted to have fever of 100.7.  A CT scan of the abdomen and pelvis were  ordered.  At first, there was some question of whether she had contrast in  her vagina and this is the reason I was called.  She has not been passing  any type of enteric material per her vagina.  She had some bloody vaginal  discharge following sexual intercourse yesterday.   PAST MEDICAL HISTORY:  1. Urinary tract infection.  2. Cocaine abuse.  3. Leiomyomata of the uterus.  4. Alcohol abuse.   PAST SURGICAL HISTORY:  1. Bilateral tubal ligation.  2. TAH/BSO.   ALLERGIES:  No known drug allergies.   CURRENT MEDICATIONS:  None.   SOCIAL HISTORY:  She drinks one or two alcoholic beverages a day.  She does  dip snuff.   REVIEW OF SYMPTOMS:  CARDIOVASCULAR:  She denies heart disease or  hypertension.  PULMONARY:  She denies pneumonia, emphysema,  bronchitis, or  tuberculosis.  GI:  She denies any hepatitis, peptic ulcer disease,  diverticulitis or colitis.  SKIN:  She states she put a heating pad on her  lower abdomen and then developed a rash from that.  GU:  As per HPI.  NEUROLOGIC:  No strokes or seizures.  HEMATOLOGIC:  No known bleeding  sources or blood clots.   PHYSICAL EXAMINATION:  GENERAL APPEARANCE:  A thin female who appears  somewhat uncomfortable after waking her up.  VITAL SIGNS:  Her maximum temperature in the emergency department was 100.7,  currently is 99.2.  Blood pressure is 101/62, pulse 63, respiratory rate 18,  saturations 100% on room air.  HEENT:  Eyes:  Extraocular movements intact.  No icterus noted.  NECK:  Supple without palpable masses or  obvious thyroid enlargement.  LUNGS:  Breath sounds are equal and clear.  Respirations unlabored.  CARDIOVASCULAR:  Regular rate and rhythm.  No lower extremity edema noted.  ABDOMEN:  Soft with small subumbilical scarring, a lower transverse scar.  She has active bowel sounds.  She does have tenderness and guarding to  palpation and percussion of suprapubic region.  Also has left flank  tenderness.  No right flank tenderness.  No obvious masses noted in the  upper abdomen.  There is a fullness in the lower abdomen noted.   LABORATORY DATA:  White blood cell count 14,700, hemoglobin 11.4.  Urinalysis demonstrates 21 to 50 white blood cells, 7 to 10 red blood cells,  few bacteria.   CT scan was reviewed with Dr. Pecolia Ades, includes ___________  pelvis cuts.  Where the vaginal suture line appears to be, there is some enhancement.  Contrast goes throughout the entire small-bowel into the colon.  No evidence  of obstruction.  Appendices were normal.  No colitis.  There is one focal  loop of small-bowel that is slightly thickened.  She has prominence of both  the pelvis and her kidneys.  There is a small amount of fluid in the right  pelvic region which does not  appear to be acute as it appears to have some  organization to it.   IMPRESSION:  Suprapubic abdominal pain with urinary frequency, urgency and  dysuria.  Has progressive discomfort.  Given her urinalysis findings, it  appears that she has cystitis and possibly some pyelonephritis with left  flank pain.  I do not see an acute surgical process based on my examination  and on her CT scan findings.  I think she does have mild ileus, maybe some  mild irritation of her small-bowel secondary to a cystitis.   RECOMMENDATIONS:  I think she needs to be admitted to a medical service and  treated for her cystitis and possible pyelonephritis.  I will check a urine  culture and treat her with antibiotics.  I do not see any reason to pursue  work-up for fistula or bowel preparations.  I see no evidence of this  currently.                                               Adolph Pollack, M.D.    Kari Baars  D:  12/03/2003  T:  12/04/2003  Job:  [id number]

## 2011-05-01 NOTE — H&P (Signed)
Cheryl Chase, Cheryl Chase                               ACCOUNT NO.:  [id number]   MEDICAL RECORD NO.:  [id number]                   PATIENT TYPE:   LOCATION:                                       FACILITY:   PHYSICIAN:  Timothy P. Fontaine, M.D.           DATE OF BIRTH:   DATE OF ADMISSION:  DATE OF DISCHARGE:                                HISTORY & PHYSICAL   ANTICIPATED DATE OF ADMISSION:  She is being admitted to [facility]  October 12th at 7:30 a.m. for surgery.   CHIEF COMPLAINT:  Irregular menses, leiomyomata.   HISTORY OF PRESENT ILLNESS:  A 55 year old G 4, P 4 female status post tubal  sterilization with a history of increasing irregular menses with daily  bleeding up to a hemorrhage status.  Her exam reveals a 16-week size  irregular uterus and on ultrasound has multiple myomas that range up to 57  mm.  Both adnexa are transabdominally visualized and although her ovaries  are poorly visualized they are grossly normal.  A sonohysterogram was  performed and there is no gross intracavitary abnormalities.  Her hemoglobin  is 10.  Her hormonal studies to include TSH, FSH, and prolactin are all  normal.  She was started on high-dose progesterone therapy, has continued to  bleed on and off on a daily basis.  Her endometrial stripe was noted to be  5.2 mm on ultrasonography.  The patient is admitted at this time for a total  abdominal hysterectomy, bilateral salpingo-oophorectomy.   PAST MEDICAL HISTORY:  Uncomplicated   PAST SURGICAL HISTORY:  Significant for a postpartum tubal sterilization.   ALLERGIES:  No known allergies.   CURRENT MEDICATIONS:  Include her Provera 10 mg t.i.d. and a narcotic pain  medication.   REVIEW OF SYSTEMS:  None.   SOCIAL HISTORY:  Noncontributory.   PHYSICAL EXAMINATION:  VITAL SIGNS:  Afebrile, vital signs are stable.  HEENT:  Normal.  LUNGS:  Clear.  CARDIAC:  Regular rate without rubs, murmurs or gallops.  ABDOMEN:  Benign, noting  suprapubic mass palpable consistent with a  leiomyomata.  PELVIC:  External BUS, vagina normal.  Cervix grossly normal.  Uterus:  Bulky 16 weeks to 18 weeks size, irregular.  No acute changes.   ASSESSMENT:  A 55 year old gravida 4, para 4 female  status post tubal  sterilization, increasing menorrhagia now with daily bleeding over the last  several months, anemia, increasing pelvic pain for a total abdominal  hysterectomy bilateral salpingo-oophorectomy.  The risks, benefits,  indications and alternatives for the procedure to include the expected  intraoperative courses and postoperative courses were reviewed with the  patient.  The ovarian conservation issue was discussed with her and the  options of keeping her ovaries versus having them removed are reviewed and  the issues of hormone replacement therapy, menopausal symptomatology, as  well as of keeping her ovaries, the issue of reoperation  for ovarian  disease, as well as ovarian cancer in the future was all discussed with her.  The patient elects to remove both ovaries.  She understands the issues and  accepts the potential for menopausal symptoms.  Absolute sterility  associated with hysterectomy was discussed, understood, and accepted.  Sexuality following hysterectomy was also reviewed and the risks of  persistent dyspareunia as well as orgasmic dysfunction following  hysterectomy was all discussed, understood, and accepted.  The realistic  risk of transfusion during the procedure or following the procedure was  discussed with her and the risk of transfusion reaction, hepatitis, HIV, mad  cow disease, and other unknown entities was all discussed, understood, and  accepted.  The risks of infection both incisional requiring opening and  draining of incisions, closure by secondary intention, internal requiring  prolonged antibiotics as well as abscess formation requiring reoperation and  abscess drainage was all discussed,  understood, and accepted.  The risks of  inadvertent injury to internal organs including bowel, bladders, ureters,  vessels, and nerves necessitating major exploratory reparative surgeries and  future reparative surgeries including ostomy formation was all discussed,  understood, and accepted.  The patient's questions were answered to her  satisfaction and she is ready to proceed with the surgery.                                               Timothy P. Audie Box, M.D.    TPF/MEDQ  D:  09/18/2003  T:  09/18/2003  Job:  [id number]

## 2011-05-01 NOTE — Discharge Summary (Signed)
NAME:  Cheryl Chase, Cheryl Chase                            ACCOUNT NO.:  [id number]   MEDICAL RECORD NO.:  [id number]                   PATIENT TYPE:  INP   LOCATION:  5007                                 FACILITY:  [facility]   PHYSICIAN:  Jonna L. Robb Matar, M.D.            DATE OF BIRTH:  October 08, 1956   DATE OF ADMISSION:  12/03/2003  DATE OF DISCHARGE:  12/07/2003                                 DISCHARGE SUMMARY   FINAL DIAGNOSES:  1. Left pyelonephritis.  2. Menorrhagia.   HISTORY:  This 55 year old Caucasian female has a history of recurrent  kidney infection.  Two months ago, had an abdominal hysterectomy and BSO for  fibroids with metrorrhagia.  She had a three-day history of left flank pain,  suprapubic pain, pain worsened with intercourse, and postcoital spotting.  An abdominal CT was done which showed no evidence of an enterovaginal  fistula.   ALLERGIES:  None.   SOCIAL HISTORY:  Occasional crack cocaine.   PHYSICAL EXAMINATION:  VITAL SIGNS:  Temperature of 100.7.  ABDOMEN:  Suprapubic abdominal tenderness.  Mild epigastric tenderness.  Left CVAT.   INITIAL LABORATORY:  Hemoglobin 11.4, white count 14.7, which returned to  normal.  Potassium 3.4.  She had positive esterase and pyuria.   HOSPITAL COURSE:  The patient was put on IV fluids and IV Cipro and Flagyl.  The patient improved.  She also had episodes of hot flashes, which were  treated with Climara.  She was seen in consultation by Dr. Avel Peace  per the emergency room.   DISPOSITION:  The patient will be discharged on Cipro 500  b.i.d. for a  week, Flagyl 500 b.i.d. for a week, Climara 0.25 mg patch weekly.  She will  have a follow-up appointment in [facility] in one week.  She has been told  not to delay or to skip any antibiotics.   Discharge time, including discussion with case manager, was 35 minutes.                                                Jonna L. Robb Matar, M.D.    Dorna Bloom  D:  12/07/2003  T:   12/09/2003  Job:  [id number]   cc:   Dala Dock

## 2011-05-01 NOTE — Op Note (Signed)
NAME:  Cheryl Chase, Cheryl Chase                            ACCOUNT NO.:  [id number]   MEDICAL RECORD NO.:  [id number]                   PATIENT TYPE:  INP   LOCATION:  9399                                 FACILITY:  [facility]   PHYSICIAN:  Timothy P. Fontaine, M.D.           DATE OF BIRTH:  04/14/1956   DATE OF PROCEDURE:  09/25/2003  DATE OF DISCHARGE:                                 OPERATIVE REPORT   PREOPERATIVE DIAGNOSIS:  Leiomyomata.   POSTOPERATIVE DIAGNOSIS:  Leiomyomata.   PROCEDURE:  Total abdominal hysterectomy and bilateral salpingo-  oophorectomy.   SURGEON:  Timothy P. Fontaine, M.D.   ASSISTANT:  Ivor Costa. Farrel Gobble, M.D.   ANESTHESIA:  General endotracheal.   ESTIMATED BLOOD LOSS:  100 mL.   COMPLICATIONS:  None.   SPECIMENS:  Uterus, fallopian tube segments bilaterally, ovaries  bilaterally.   FINDINGS:  Grossly enlarged uterus with multiple myomas, intraoperative  weight 700-800 g.  Fallopian tube segments bilaterally grossly normal.  Right and left ovaries grossly normal.  Pelvis grossly normal without  adhesive disease or evidence of endometriosis.   DESCRIPTION OF PROCEDURE:  The patient was taken to the operating room,  underwent general endotracheal anesthesia, received an abdominal, vaginal,  perineal preparation with Betadine solution per nursing personnel.  A Foley  catheter was placed using sterile technique, and the patient was draped in  the usual fashion.  The abdomen was sharply entered through a Pfannenstiel  incision, achieving adequate hemostasis.  The Balfour retractor and bladder  blade were placed within the incision and the intestines were packed from  the operative field.  The uterus was elevated through the incision, the  right round ligament identified, transected with electrocautery, and the  anterior peritoneal refection of the vesicouterine fold was sharply incised.  The infundibulopelvic ligament was then isolated on the right, doubly  clamped, cut, and doubly ligated using 0 Vicryl suture.  The posterior  peritoneal reflection was then sharply incised along the posterior aspect of  the uterus.  The uterine vessels were then skeletonized and clamped, cut,  and doubly ligated using 0 Vicryl suture.  A similar procedure was then  carried out on the other side.  The anterior bladder flap was then  progressively developed sharply and the uterus was progressively freed from  its attachments with clamping, cutting, and ligating of the parametrial  tissues and cardinal ligaments using 0 Vicryl suture.  The vagina was then  crossclamped bilaterally below the cervix and the uterus was excised.  The  cervix was inspected to assure complete excision.  Right and left angle  sutures were then placed using 0 Vicryl suture and tagged for future  reference.  The vagina was then closed anterior to posterior with two figure-  of-eight interrupted sutures.  The pelvis was copiously irrigated.  Adequate  hemostasis was visualized, all pedicles reinspected, and again adequate  hemostasis visualized.  The tagged angle  sutures were then cut, the bowel  packing removed, the Balfour retractor and bladder blade removed, and the  anterior fascia was reapproximated using 0 Vicryl suture in a running  stitch.  The subcutaneous tissues were irrigated, adequate hemostasis  achieved with electrocautery.  The skin reapproximated with 4-0 Vicryl in a  running subcuticular stitch, Benzoin and Steri-Strips applied, a sterile  dressing applied, the patient awakened without difficulty and taken to the  recovery room in good condition, noting abundant clear yellow urine.                                               Timothy P. Audie Box, M.D.    TPF/MEDQ  D:  09/25/2003  T:  09/25/2003  Job:  [id number]

## 2011-05-18 ENCOUNTER — Ambulatory Visit (HOSPITAL_COMMUNITY)
Admission: RE | Admit: 2011-05-18 | Discharge: 2011-05-18 | Disposition: A | Discharge status: Home / Self Care | Payer: Medicare Other | Source: Ambulatory Visit | Attending: Family Medicine | Admitting: Family Medicine

## 2011-05-18 DIAGNOSIS — Z1231 Encounter for screening mammogram for malignant neoplasm of breast: Secondary | ICD-10-CM | POA: Insufficient documentation

## 2012-05-17 ENCOUNTER — Other Ambulatory Visit (HOSPITAL_COMMUNITY): Payer: Self-pay | Admitting: Family Medicine

## 2012-05-17 DIAGNOSIS — Z1231 Encounter for screening mammogram for malignant neoplasm of breast: Secondary | ICD-10-CM

## 2012-06-08 ENCOUNTER — Ambulatory Visit (HOSPITAL_COMMUNITY): Payer: Medicare Other

## 2012-06-10 ENCOUNTER — Emergency Department (HOSPITAL_COMMUNITY)
Admission: EM | Admit: 2012-06-10 | Discharge: 2012-06-10 | Disposition: A | Discharge status: Home / Self Care | Payer: Medicare Other | Attending: Emergency Medicine | Admitting: Emergency Medicine

## 2012-06-10 ENCOUNTER — Encounter (HOSPITAL_COMMUNITY): Payer: Self-pay

## 2012-06-10 DIAGNOSIS — X30XXXA Exposure to excessive natural heat, initial encounter: Secondary | ICD-10-CM | POA: Insufficient documentation

## 2012-06-10 DIAGNOSIS — Y92009 Unspecified place in unspecified non-institutional (private) residence as the place of occurrence of the external cause: Secondary | ICD-10-CM | POA: Insufficient documentation

## 2012-06-10 DIAGNOSIS — Y93H2 Activity, gardening and landscaping: Secondary | ICD-10-CM | POA: Insufficient documentation

## 2012-06-10 DIAGNOSIS — Z8739 Personal history of other diseases of the musculoskeletal system and connective tissue: Secondary | ICD-10-CM | POA: Insufficient documentation

## 2012-06-10 DIAGNOSIS — T675XXA Heat exhaustion, unspecified, initial encounter: Secondary | ICD-10-CM | POA: Insufficient documentation

## 2012-06-10 DIAGNOSIS — I10 Essential (primary) hypertension: Secondary | ICD-10-CM | POA: Insufficient documentation

## 2012-06-10 HISTORY — DX: Essential (primary) hypertension: I10

## 2012-06-10 HISTORY — DX: Unspecified osteoarthritis, unspecified site: M19.90

## 2012-06-10 LAB — POCT I-STAT TROPONIN I: Troponin i, poc: 0.01 ng/mL (ref 0.00–0.08)

## 2012-06-10 LAB — CK: Total CK: 108 U/L (ref 7–177)

## 2012-06-10 MED ORDER — SODIUM CHLORIDE 0.9 % IV BOLUS (SEPSIS)
2000.0000 mL | Freq: Once | INTRAVENOUS | Status: AC
  Administered 2012-06-10: 2000 mL via INTRAVENOUS

## 2012-06-10 NOTE — Discharge Instructions (Signed)
Heat Illness Stay in a cool and vimentin drink lots of cool fluids for the rest of today. Heat exhaustion happens when the body loses too much water through sweating. This can lead to heat stroke. Heat stroke is a medical emergency. People who work in [employer], athletes, and older people are at greater risk for suffering from heat illness. SYMPTOMS   Exhaustion.   Dizziness.   Fainting.   Muscle cramps.   Nausea.   Vomiting.   Chills and goose bumps.  PREVENTION   You must drink increased amounts of water or other clear liquids during hot weather to prevent heat illness.   This is especially true if you work or do vigorous exercise in the heat. Up to a gallon of sweat can be lost every hour extremely hot or humid conditions.   You will stay cooler by reducing your effort and by dousing yourself with water often.   Certain drugs increase the risk of heat illness because they reduce sweating. These include antidepressants and antihistamines.   Be more cautious during hot weather.   Drink several glasses of water before, during, and after vigorous activity.  SEEK MEDICAL CARE IF:  You have any heat-related problems. Document Released: 01/07/2005 Document Revised: 11/19/2011 Document Reviewed: 08/30/2008 [facility] Patient Information 2012 [place], [place].

## 2012-06-10 NOTE — ED Notes (Signed)
JYN:[id number]<AO> Expected date:06/10/12<BR> Expected time:<BR> Means of arrival:<BR> Comments:<BR> M35 - 55yof -heat exposure / hypotensive

## 2012-06-10 NOTE — ED Notes (Signed)
IV access Attempted x2 with no success, Notified 2nd RN to attempt IV access

## 2012-06-10 NOTE — ED Notes (Signed)
Pt was mowing grass with long sleeve shirt and sweat pants on, found on ground by neighbors, tried to help her up, pt was too weak to get up.  Per EMS pt could not answer questions, ems cooled pt down.

## 2012-06-10 NOTE — ED Provider Notes (Signed)
History     CSN: [id number]  Arrival date & time 06/10/12  1128   First MD Initiated Contact with Patient 06/10/12 1137      Chief Complaint  Patient presents with  . Heat Exposure    (Consider location/radiation/quality/duration/timing/severity/associated sxs/prior treatment) HPI Patient became generally weak and lightheaded while mowing grass this morning and an extremely hot humid environment. She was noted to be hypotensive by EMS. No treatment rendered prior to coming here she feels much improved since being here and taken out of hot environment. Past Medical History  Diagnosis Date  . Arthritis   . Hypertension     No past surgical history on file.  No family history on file.  History  Substance Use Topics  . Smoking status: Not on file  . Smokeless tobacco: Not on file  . Alcohol Use:    No tobacco no alcohol no drugs OB History    Grav Para Term Preterm Abortions TAB SAB Ect Mult Living                  Review of Systems  Constitutional: Positive for fatigue.  HENT: Negative.   Respiratory: Negative.   Cardiovascular: Negative.   Gastrointestinal: Negative.   Musculoskeletal: Positive for arthralgias.       Chronic bilateral knee pain  Skin: Negative.   Neurological: Positive for weakness.  Hematological: Negative.   Psychiatric/Behavioral: Negative.   All other systems reviewed and are negative.    Allergies  Review of patient's allergies indicates no known allergies.  Home Medications  No current outpatient prescriptions on file.  BP 88/47  Pulse 66  Temp 97.5 F (36.4 C) (Oral)  Resp 12  SpO2 98%  Physical Exam  Nursing note and vitals reviewed. Constitutional: She appears well-developed and well-nourished.  HENT:  Head: Normocephalic and atraumatic.       Mucous membranes dry  Eyes: Conjunctivae are normal. Pupils are equal, round, and reactive to light.  Neck: Neck supple. No tracheal deviation present. No thyromegaly present.    Cardiovascular: Normal rate and regular rhythm.   No murmur heard. Pulmonary/Chest: Effort normal and breath sounds normal.  Abdominal: Soft. Bowel sounds are normal. She exhibits no distension. There is no tenderness.  Musculoskeletal: Normal range of motion. She exhibits no edema and no tenderness.  Neurological: She is alert. Coordination normal.  Skin: Skin is warm and dry. No rash noted.  Psychiatric: She has a normal mood and affect.    ED Course  Procedures (including critical care time) 3:20 PM patient alert Glasgow Coma Score 15 feels much improved after treatment with intravenous fluids. Ate a meal in the emergency dept  Labs Reviewed  CK   No results found.   No diagnosis found.    MDM  Plan patient encouraged to drink lots of fluids and stay in the environment. No further treatment needed Diagnosis heat exhaustion        Doug Sou, MD 06/10/12 1526

## 2012-06-10 NOTE — Progress Notes (Signed)
pt states her primary pcp is eva shaw EPIC updated

## 2012-06-21 ENCOUNTER — Ambulatory Visit (HOSPITAL_COMMUNITY)
Admission: RE | Admit: 2012-06-21 | Discharge: 2012-06-21 | Disposition: A | Discharge status: Home / Self Care | Payer: Medicare Other | Source: Ambulatory Visit | Attending: Family Medicine | Admitting: Family Medicine

## 2012-06-21 DIAGNOSIS — Z1231 Encounter for screening mammogram for malignant neoplasm of breast: Secondary | ICD-10-CM

## 2013-05-23 ENCOUNTER — Other Ambulatory Visit (HOSPITAL_COMMUNITY): Payer: Self-pay | Admitting: Family Medicine

## 2013-05-23 DIAGNOSIS — Z1231 Encounter for screening mammogram for malignant neoplasm of breast: Secondary | ICD-10-CM

## 2013-06-22 ENCOUNTER — Ambulatory Visit (HOSPITAL_COMMUNITY): Payer: Medicare Other

## 2013-06-23 ENCOUNTER — Ambulatory Visit (HOSPITAL_COMMUNITY)
Admission: RE | Admit: 2013-06-23 | Discharge: 2013-06-23 | Disposition: A | Discharge status: Home / Self Care | Payer: Medicare Other | Source: Ambulatory Visit | Attending: Family Medicine | Admitting: Family Medicine

## 2013-06-23 DIAGNOSIS — Z1231 Encounter for screening mammogram for malignant neoplasm of breast: Secondary | ICD-10-CM | POA: Insufficient documentation

## 2014-06-11 ENCOUNTER — Other Ambulatory Visit (HOSPITAL_COMMUNITY): Payer: Self-pay | Admitting: Family Medicine

## 2014-06-11 DIAGNOSIS — Z1231 Encounter for screening mammogram for malignant neoplasm of breast: Secondary | ICD-10-CM

## 2014-06-25 ENCOUNTER — Ambulatory Visit (HOSPITAL_COMMUNITY)
Admission: RE | Admit: 2014-06-25 | Discharge: 2014-06-25 | Disposition: A | Discharge status: Home / Self Care | Payer: Medicare Other | Source: Ambulatory Visit | Attending: Family Medicine | Admitting: Family Medicine

## 2014-06-25 DIAGNOSIS — Z1231 Encounter for screening mammogram for malignant neoplasm of breast: Secondary | ICD-10-CM | POA: Insufficient documentation

## 2014-12-10 ENCOUNTER — Other Ambulatory Visit: Payer: Self-pay | Admitting: Cardiology

## 2014-12-10 ENCOUNTER — Ambulatory Visit
Admission: RE | Admit: 2014-12-10 | Discharge: 2014-12-10 | Disposition: A | Discharge status: Home / Self Care | Payer: Medicare Other | Source: Ambulatory Visit | Attending: Cardiology | Admitting: Cardiology

## 2014-12-10 DIAGNOSIS — R0789 Other chest pain: Secondary | ICD-10-CM

## 2014-12-24 ENCOUNTER — Encounter (HOSPITAL_COMMUNITY): Payer: Self-pay | Admitting: Cardiology

## 2014-12-24 DIAGNOSIS — E785 Hyperlipidemia, unspecified: Secondary | ICD-10-CM

## 2014-12-24 DIAGNOSIS — E663 Overweight: Secondary | ICD-10-CM

## 2014-12-24 DIAGNOSIS — R9430 Abnormal result of cardiovascular function study, unspecified: Secondary | ICD-10-CM | POA: Diagnosis present

## 2014-12-24 DIAGNOSIS — I7090 Unspecified atherosclerosis: Secondary | ICD-10-CM

## 2014-12-24 DIAGNOSIS — M519 Unspecified thoracic, thoracolumbar and lumbosacral intervertebral disc disorder: Secondary | ICD-10-CM

## 2014-12-24 DIAGNOSIS — I10 Essential (primary) hypertension: Secondary | ICD-10-CM

## 2014-12-24 DIAGNOSIS — R079 Chest pain, unspecified: Secondary | ICD-10-CM

## 2014-12-24 DIAGNOSIS — K219 Gastro-esophageal reflux disease without esophagitis: Secondary | ICD-10-CM

## 2014-12-24 HISTORY — DX: Unspecified atherosclerosis: I70.90

## 2014-12-24 HISTORY — DX: Overweight: E66.3

## 2014-12-24 HISTORY — DX: Essential (primary) hypertension: I10

## 2014-12-24 HISTORY — DX: Gastro-esophageal reflux disease without esophagitis: K21.9

## 2014-12-24 HISTORY — DX: Unspecified thoracic, thoracolumbar and lumbosacral intervertebral disc disorder: M51.9

## 2014-12-24 HISTORY — DX: Hyperlipidemia, unspecified: E78.5

## 2014-12-24 MED ORDER — SODIUM CHLORIDE 0.9 % IJ SOLN
3.0000 mL | INTRAMUSCULAR | Status: DC | PRN
Start: 2014-12-24 — End: 2014-12-27

## 2014-12-24 MED ORDER — SODIUM CHLORIDE 0.9 % IJ SOLN: 3.0000 mL | Freq: Two times a day (BID) | INTRAMUSCULAR | Status: DC

## 2014-12-24 MED ORDER — SODIUM CHLORIDE 0.9 % IV SOLN
INTRAVENOUS | Status: DC
  Administered 2014-12-27: 08:00:00 via INTRAVENOUS

## 2014-12-24 MED ORDER — ASPIRIN 81 MG PO CHEW
81.0000 mg | CHEWABLE_TABLET | ORAL | Status: AC
Start: 2014-12-25 — End: 2014-12-26

## 2014-12-24 MED ORDER — SODIUM CHLORIDE 0.9 % IV SOLN: 250.0000 mL | INTRAVENOUS | Status: DC | PRN

## 2014-12-24 NOTE — H&P (Addendum)
Cheryl Chase, Cheryl Chase  Date of visit:  12/24/2014 DOB:  07-Dec-1956    Age:  59 yrs. Medical record number:  [id number]     Account number:  [id number] Primary Care Provider: Windle GuardELKINS, WILSON ____________________________ CURRENT DIAGNOSES  1. Atherosclerosis  2. Chest pain  3. Abnormal result of cardiovascular function study, unspecified  4. Syncope and collapse  5. Essential hypertension  6. Overweight  7. Gastro-esophageal reflux disease  8. Encounter for preprocedural cardiovascular examination ____________________________ ALLERGIES  No Known Allergies ____________________________ MEDICATIONS  1. tramadol 50 mg tablet, PRN  2. ranitidine 150 mg tablet, 1 or 2 qd  3. lisinopril 20 mg tablet, 1 p.o. daily  4. Estrace 2 mg tablet, 1 p.o. daily  5. zolpidem 10 mg tablet, QHS  6. cyclobenzaprine 10 mg tablet, QHS  7. docusate sodium 100 mg tablet, 1 p.o. daily ____________________________ HISTORY OF PRESENT ILLNESS Patient seen for evaluation prior to catheterization. She was initially seen at the request of Dr. Jeannetta NapElkins for evaluation of exertional chest tightness as well as a syncopal episode. The patient has a prior history of hypertension and has a strong family history of premature cardiac disease. She has had some exertional dyspnea and also has had some tightness in her chest that is more noticeable when she lies down. She finds that she does have to stop and rest if she has activity. She had a recent syncopal episode where she had gone to the bathroom and then woke up and found herself in feces. She evidently had a Holter monitor done at her primary physician's office and I was asked to evaluate her because of the exertional tightness. She is frightened because she has a sister that has a defibrillator and another sister has had a number of stents. She is currently divorced and lives with her sister. She denies PND, or orthopnea. She is disabled because of severe low back pain and leg pain and has  some mild edema that she will use support stockings for.  She had a myocardial perfusion scan that showed an ejection fraction of 44% as well as possible posterolateral is ischemia. The test was indeterminate and do to a fair amount of artifact. Her chest x-ray showed calcification present in her aorta suggestive of coronary disease or atherosclerosis. ____________________________ PAST HISTORY  Past Medical Illnesses:  syncope, hypertension, GERD, obesity;  Cardiovascular Illnesses:  no previous history of cardiac disease.;  Surgical Procedures:  hysterectomy-total;  NYHA Classification:  I;  Canadian Angina Classification:  Class 2: Angina with moderate exertion;  Cardiology Procedures-Invasive:  no history of prior cardiac procedures;  Cardiology Procedures-Noninvasive:  event monitor, lexiscan cardiolite January 2016, echocardiogram January 2016;  LVEF of 44% documented via nuclear study on 12/20/2014,   ____________________________ CARDIO-PULMONARY TEST DATES EKG Date:  12/10/2014;  Nuclear Study Date:  12/20/2014;  Echocardiography Date: 12/24/2014;  Chest Xray Date: 12/10/2014;   ____________________________ FAMILY HISTORY Father -- Father dead, Hypertension, Myocardial infarction Mother -- Mother dead, Bone cancer Sister -- Sister dead, Death of unknown cause Sister -- Sister dead Sister -- Sister alive with problem, Coronary arteriosclerosis Sister -- Sister alive with problem, Diabetes mellitus, Hypertension Sister -- Sister alive with problem, Pacemaker in situ Sister -- Sister dead, Sudden infant death Sister -- Sister alive with problem, Hypertension ____________________________ SOCIAL HISTORY Alcohol Use:  no alcohol use, perior history of cocaince and alcohol abuse  Smoking:  never smoked;  Diet:  regular diet;  Lifestyle:  divorced;  Exercise:  no regular exercise;  Occupation:  disabled;  Residence:  lives with sister;   ____________________________ REVIEW OF SYSTEMS General:   denies recent weight change, fatique or change in exercise tolerance.  Integumentary: no rashes or new skin lesions. Eyes: denies diplopia, history of glaucoma or visual problems. Ears, Nose, Throat, Mouth:  denies any hearing loss, epistaxis, hoarseness or difficulty speaking. Respiratory: dyspnea with exertion Cardiovascular:  please review HPI Abdominal: constipation Genitourinary-Female: no dysuria, urgency,  frequency, UTIs, or stress incontinence  Musculoskeletal:  chronic back pain Neurological:  headaches Psychiatric:  Anxiety and some depression ____________________________ PHYSICAL EXAMINATION VITAL SIGNS  Blood Pressure:  134/80 Sitting, Right arm, large cuff  , 130/80 Standing, Right arm and large cuff   Pulse:  64/min. Weight:  165.00 lbs. Height:  64"BMI: 28  Constitutional:  Anxious, soft spoken white female, in no acute distress, mildly obese Skin:  scattered tattoos Head:  normocephalic, normal hair pattern, no masses or tenderness Eyes:  EOMS Intact, PERRLA, C and S clear, Funduscopic exam not done. ENT:  ears, nose and throat reveal no gross abnormalities.  Dentition good. Neck:  supple, without massess. No JVD, thyromegaly or carotid bruits. Carotid upstroke normal. Chest:  normal symmetry, clear to auscultation. Cardiac:  regular rhythm, normal S1 and S2, No S3 or S4, no murmurs, gallops or rubs detected. Abdomen:  abdomen soft,non-tender, no masses, no hepatospenomegaly, or aneurysm noted Peripheral Pulses:  the femoral,dorsalis pedis, and posterior tibial pulses are full and equal bilaterally with no bruits auscultated. Extremities & Back:  no deformities, clubbing, cyanosis, erythema or edema observed. Normal muscle strength and tone. Neurological:  no gross motor or sensory deficits noted, affect appropriate, oriented x3. ____________________________ IMPRESSIONS/PLAN  1. Abnormal myocardial perfusion scan with diminished ejection fraction and possible lateral  ischemia 2. Recent syncopal episode 3. Hypertension with family history of cardiac disease which is strong 4. Chest discomfort with some atypical features 5. Atherosclerosis noted on chest x-ray  Recommendations:  Plan cardiac catheterization to assess for coronary artery disease. Cardiac catheterization was discussed with the patient including risks of myocardial infarction, death, stroke, bleeding, arrhythmia, dye allergy, or renal insufficiency. He understands and is willing to proceed. Possibility of percutaneous intervention at the same setting was also discussed with the patient including risks. Echocardiogram was done today. ____________________________ TODAYS ORDERS  1. Draw PT/INR: Today  2. PTT: Today  3. Basic Metabolic Panel: Today  4. Cardiac Cath Thursday                       ____________________________ Cardiology Physician:  Darden Palmer MD [facility]

## 2014-12-27 ENCOUNTER — Encounter (HOSPITAL_COMMUNITY)
Admission: RE | Disposition: A | Discharge status: Home / Self Care | Payer: Self-pay | Source: Ambulatory Visit | Attending: Cardiology

## 2014-12-27 ENCOUNTER — Ambulatory Visit (HOSPITAL_COMMUNITY)
Admission: RE | Admit: 2014-12-27 | Discharge: 2014-12-27 | Disposition: A | Discharge status: Home / Self Care | Payer: Medicare Other | Source: Ambulatory Visit | Attending: Cardiology | Admitting: Cardiology

## 2014-12-27 ENCOUNTER — Encounter (HOSPITAL_COMMUNITY): Payer: Self-pay | Admitting: General Practice

## 2014-12-27 DIAGNOSIS — I2510 Atherosclerotic heart disease of native coronary artery without angina pectoris: Secondary | ICD-10-CM | POA: Insufficient documentation

## 2014-12-27 DIAGNOSIS — R079 Chest pain, unspecified: Secondary | ICD-10-CM

## 2014-12-27 DIAGNOSIS — Z8249 Family history of ischemic heart disease and other diseases of the circulatory system: Secondary | ICD-10-CM | POA: Diagnosis not present

## 2014-12-27 DIAGNOSIS — R55 Syncope and collapse: Secondary | ICD-10-CM | POA: Diagnosis not present

## 2014-12-27 DIAGNOSIS — Z01818 Encounter for other preprocedural examination: Secondary | ICD-10-CM | POA: Diagnosis not present

## 2014-12-27 DIAGNOSIS — E663 Overweight: Secondary | ICD-10-CM | POA: Diagnosis not present

## 2014-12-27 DIAGNOSIS — I429 Cardiomyopathy, unspecified: Secondary | ICD-10-CM | POA: Diagnosis not present

## 2014-12-27 DIAGNOSIS — E785 Hyperlipidemia, unspecified: Secondary | ICD-10-CM

## 2014-12-27 DIAGNOSIS — I7090 Unspecified atherosclerosis: Secondary | ICD-10-CM

## 2014-12-27 DIAGNOSIS — I10 Essential (primary) hypertension: Secondary | ICD-10-CM | POA: Diagnosis not present

## 2014-12-27 DIAGNOSIS — K219 Gastro-esophageal reflux disease without esophagitis: Secondary | ICD-10-CM

## 2014-12-27 DIAGNOSIS — R9430 Abnormal result of cardiovascular function study, unspecified: Secondary | ICD-10-CM | POA: Diagnosis present

## 2014-12-27 DIAGNOSIS — R0789 Other chest pain: Secondary | ICD-10-CM | POA: Diagnosis not present

## 2014-12-27 DIAGNOSIS — M519 Unspecified thoracic, thoracolumbar and lumbosacral intervertebral disc disorder: Secondary | ICD-10-CM

## 2014-12-27 HISTORY — DX: Gastro-esophageal reflux disease without esophagitis: K21.9

## 2014-12-27 HISTORY — DX: Unspecified atherosclerosis: I70.90

## 2014-12-27 HISTORY — PX: LEFT HEART CATHETERIZATION WITH CORONARY ANGIOGRAM: SHX5451

## 2014-12-27 HISTORY — DX: Unspecified thoracic, thoracolumbar and lumbosacral intervertebral disc disorder: M51.9

## 2014-12-27 HISTORY — DX: Hyperlipidemia, unspecified: E78.5

## 2014-12-27 HISTORY — DX: Overweight: E66.3

## 2014-12-27 HISTORY — DX: Essential (primary) hypertension: I10

## 2014-12-27 LAB — CK TOTAL AND CKMB (NOT AT ARMC)
CK, MB: 1.8 ng/mL (ref 0.3–4.0)
Relative Index: INVALID (ref 0.0–2.5)
Total CK: 83 U/L (ref 7–177)

## 2014-12-27 SURGERY — LEFT HEART CATHETERIZATION WITH CORONARY ANGIOGRAM

## 2014-12-27 MED ORDER — ACETAMINOPHEN 325 MG PO TABS: 650.0000 mg | ORAL_TABLET | ORAL | Status: DC | PRN

## 2014-12-27 MED ORDER — ONDANSETRON HCL 4 MG/2ML IJ SOLN: 4.0000 mg | Freq: Four times a day (QID) | INTRAMUSCULAR | Status: DC | PRN

## 2014-12-27 MED ORDER — HEPARIN (PORCINE) IN NACL 2-0.9 UNIT/ML-% IJ SOLN
INTRAMUSCULAR | Status: AC
  Filled 2014-12-27: qty 1000

## 2014-12-27 MED ORDER — NITROGLYCERIN 1 MG/10 ML FOR IR/CATH LAB
INTRA_ARTERIAL | Status: AC
  Filled 2014-12-27: qty 10

## 2014-12-27 MED ORDER — MIDAZOLAM HCL 2 MG/2ML IJ SOLN
INTRAMUSCULAR | Status: AC
  Filled 2014-12-27: qty 2

## 2014-12-27 MED ORDER — SODIUM CHLORIDE 0.9 % IV SOLN
INTRAVENOUS | Status: DC
  Administered 2014-12-27: 13:00:00 via INTRAVENOUS

## 2014-12-27 MED ORDER — ASPIRIN 81 MG PO CHEW
CHEWABLE_TABLET | ORAL | Status: AC
  Administered 2014-12-27: 81 mg
  Filled 2014-12-27: qty 1

## 2014-12-27 MED ORDER — LIDOCAINE HCL (PF) 1 % IJ SOLN
INTRAMUSCULAR | Status: AC
  Filled 2014-12-27: qty 30

## 2014-12-27 MED ORDER — NICOTINE 21 MG/24HR TD PT24
21.0000 mg | MEDICATED_PATCH | Freq: Every day | TRANSDERMAL | Status: DC
  Administered 2014-12-27: 21 mg via TRANSDERMAL
  Filled 2014-12-27: qty 1

## 2014-12-27 NOTE — Progress Notes (Signed)
Pt discharged to home per MD order. Pt IV and telemetry box removed prior to discharge. Groin site level 0.  Pt ambulated in hallway independently. Pt instructed to await RN to bring discharge instructions.  When RN returned to room with paperwork, pt was not in room.  RN left message with pt sister to call at earliest opportunity. Dr.Tilley made aware that pt did not receive discharge paperwork prior to leaving. Joylene GrapesMonge, Hailei Besser C

## 2014-12-27 NOTE — Progress Notes (Signed)
Site area: RFASite Prior to Removal:  Level 0 Pressure Applied For:2015min Manual:   yes Patient Status During Pull:  stable Post Pull Site:  Level 0 Post Pull Instructions Given: yes  Post Pull Pulses Present: palpable Dressing Applied: clear  Bedrest begins @ 1030 Comments:

## 2014-12-27 NOTE — CV Procedure (Signed)
Cardiac Catheterization Report   Cheryl Chase    59 y.o.  female  DOB: 11/13/1956  MRN: [id number]  Today's Date:  12/27/2014    PROCEDURE:  Left heart catheterization with selective coronary angiography, left ventriculogram.  INDICATIONS:  Chest discomfort and dyspnea in a patient with an abnormal myocardial perfusion scan  The risks, benefits, and details of the procedure were explained to the patient.  The patient verbalized understanding and wanted to proceed.  Informed written consent was obtained.  PROCEDURE TECHNIQUE:  After Xylocaine anesthesia a 73F sheath was placed in the right femoral artery with a single anterior needle wall stick.   Left coronary angiography was done using a Judkins L4 guide catheter.  Right coronary angiography was done using a Judkins R4 guide catheter.  A 30 cc ventriculogram was performed in the 30 degree RAO projection.  Tolerated the procedure well.  Sheath removed in the holding area.   CONTRAST:  Total of 70 cc.  ESTIMATED BLOOD LOSS:  Minimal  COMPLICATIONS:  None.    HEMODYNAMICS:  Aortic postcontrast 140/77, LV postcontrast 140/15-22.  There was no gradient between the left ventricle and aorta.    ANGIOGRAPHIC DATA:    CORONARY ARTERIES:   Arise and distribute normally.  Right dominant. No coronary calcification is noted.  Left main coronary artery: Normal.  Left anterior descending: Normal.  Circumflex coronary artery: Normal.  Right coronary artery: There is aneurysmal dilation of the proximal portion of the artery with mild irregularity.  Prior to that aneurysmal dilation, but no significant obstructive stenosis is noted.Marland Kitchen.  LEFT VENTRICULOGRAM:  Performed in the 30 RAO projection.  The aortic and mitral valves are normal. The left ventricle is normal in size with mild global hypokinesis.  There are increased trabeculations consistent with hypertrophy.  Estimated ejection fraction is  40-45%.  IMPRESSIONS:  1. No significant obstructive coronary artery disease.  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 Abnormal ventricular function with global hypokinesis and hypertrophy  RECOMMENDATION:  Medical therapy, evaluation of other causes of syncope.  Darden PalmerW. Spencer Tilley, Jr. MD [facility]

## 2014-12-27 NOTE — Interval H&P Note (Signed)
History and Physical Interval Note:  12/27/2014 9:23 AM   Patient seen and examined.  No interval change in history and exam since last note.  Stable for procedure.  Darden PalmerW. Spencer Tilley, Jr. MD [facility]  12/27/2014

## 2014-12-27 NOTE — Progress Notes (Signed)
Pt son came to pick up pt discharge instructions on behalf of pt. RN reviewed discharge instructions and medication information with pt son.  Pt son verbalized understanding. Cheryl Chase, Makeba Delcastillo C

## 2014-12-27 NOTE — Progress Notes (Signed)
Call from nurse that patient wants to go home now.  Previously had no one to stay with her and no way home.  Niece is coming to pick her up and sister at home.    I will need to see in one week to discuss further treatment of BP and cardiomyopathy.  Darden PalmerW. Spencer Tilley, Jr. MD [facility]

## 2014-12-27 NOTE — Research (Signed)
Bioflow Informed Consent   Subject Name: Cheryl Chase  Subject met inclusion and exclusion criteria.  The informed consent form, study requirements and expectations were reviewed with the subject and questions and concerns were addressed prior to the signing of the consent form.  The subject verbalized understanding of the trail requirements.  The subject agreed to participate in the Bioflow trial and signed the informed consent.  The informed consent was obtained prior to performance of any protocol-specific procedures for the subject.  A copy of the signed informed consent was given to the subject and a copy was placed in the subject's medical record.  Sandie Ano 12/27/2014, 8:15

## 2015-06-03 ENCOUNTER — Other Ambulatory Visit (HOSPITAL_COMMUNITY): Payer: Self-pay | Admitting: Family Medicine

## 2015-06-03 DIAGNOSIS — Z1231 Encounter for screening mammogram for malignant neoplasm of breast: Secondary | ICD-10-CM

## 2015-06-27 ENCOUNTER — Ambulatory Visit (HOSPITAL_COMMUNITY): Payer: Medicare Other

## 2015-06-28 ENCOUNTER — Ambulatory Visit (HOSPITAL_COMMUNITY)
Admission: RE | Admit: 2015-06-28 | Discharge: 2015-06-28 | Disposition: A | Discharge status: Home / Self Care | Payer: Medicare Other | Source: Ambulatory Visit | Attending: Family Medicine | Admitting: Family Medicine

## 2015-06-28 DIAGNOSIS — Z1231 Encounter for screening mammogram for malignant neoplasm of breast: Secondary | ICD-10-CM | POA: Diagnosis present

## 2016-03-05 ENCOUNTER — Ambulatory Visit
Admission: RE | Admit: 2016-03-05 | Discharge: 2016-03-05 | Disposition: A | Discharge status: Home / Self Care | Payer: Medicare Other | Source: Ambulatory Visit | Attending: Family Medicine | Admitting: Family Medicine

## 2016-03-05 ENCOUNTER — Other Ambulatory Visit: Payer: Self-pay | Admitting: Family Medicine

## 2016-03-05 DIAGNOSIS — M25561 Pain in right knee: Secondary | ICD-10-CM

## 2016-03-05 DIAGNOSIS — M25562 Pain in left knee: Secondary | ICD-10-CM

## 2016-09-04 ENCOUNTER — Other Ambulatory Visit: Payer: Self-pay | Admitting: Family Medicine

## 2016-09-04 DIAGNOSIS — Z1231 Encounter for screening mammogram for malignant neoplasm of breast: Secondary | ICD-10-CM

## 2016-09-08 ENCOUNTER — Ambulatory Visit
Admission: RE | Admit: 2016-09-08 | Discharge: 2016-09-08 | Disposition: A | Discharge status: Home / Self Care | Payer: Medicare Other | Source: Ambulatory Visit | Attending: Family Medicine | Admitting: Family Medicine

## 2016-09-08 DIAGNOSIS — Z1231 Encounter for screening mammogram for malignant neoplasm of breast: Secondary | ICD-10-CM

## 2017-08-02 ENCOUNTER — Other Ambulatory Visit: Payer: Self-pay | Admitting: Family Medicine

## 2017-08-02 DIAGNOSIS — Z1231 Encounter for screening mammogram for malignant neoplasm of breast: Secondary | ICD-10-CM

## 2017-09-09 ENCOUNTER — Ambulatory Visit
Admission: RE | Admit: 2017-09-09 | Discharge: 2017-09-09 | Disposition: A | Discharge status: Home / Self Care | Payer: Medicare Other | Source: Ambulatory Visit | Attending: Family Medicine | Admitting: Family Medicine

## 2017-09-09 DIAGNOSIS — Z1231 Encounter for screening mammogram for malignant neoplasm of breast: Secondary | ICD-10-CM

## 2018-02-28 ENCOUNTER — Encounter: Payer: Self-pay | Admitting: Cardiology

## 2018-08-01 ENCOUNTER — Other Ambulatory Visit: Payer: Self-pay | Admitting: Family Medicine

## 2018-08-01 DIAGNOSIS — Z1231 Encounter for screening mammogram for malignant neoplasm of breast: Secondary | ICD-10-CM

## 2018-09-12 ENCOUNTER — Ambulatory Visit: Payer: Medicare Other

## 2018-09-16 ENCOUNTER — Ambulatory Visit: Payer: Medicare Other

## 2018-11-25 ENCOUNTER — Ambulatory Visit
Admission: RE | Admit: 2018-11-25 | Discharge: 2018-11-25 | Disposition: A | Discharge status: Home / Self Care | Payer: Medicare Other | Source: Ambulatory Visit | Attending: Family Medicine | Admitting: Family Medicine

## 2018-11-25 DIAGNOSIS — Z1231 Encounter for screening mammogram for malignant neoplasm of breast: Secondary | ICD-10-CM

## 2019-02-15 ENCOUNTER — Other Ambulatory Visit: Payer: Self-pay | Admitting: Family Medicine

## 2019-02-15 DIAGNOSIS — E2839 Other primary ovarian failure: Secondary | ICD-10-CM

## 2019-02-20 ENCOUNTER — Ambulatory Visit
Admission: RE | Admit: 2019-02-20 | Discharge: 2019-02-20 | Disposition: A | Discharge status: Home / Self Care | Payer: Medicare Other | Source: Ambulatory Visit | Attending: Family Medicine | Admitting: Family Medicine

## 2019-02-20 DIAGNOSIS — E2839 Other primary ovarian failure: Secondary | ICD-10-CM

## 2019-03-09 ENCOUNTER — Telehealth: Payer: Self-pay

## 2019-03-09 NOTE — Telephone Encounter (Signed)
Attempted to contact pt for COVID pre-screen. Left message to call back. 

## 2019-03-10 ENCOUNTER — Ambulatory Visit: Payer: Medicare Other | Admitting: Cardiology

## 2019-03-10 NOTE — Telephone Encounter (Signed)
Attempted to contact pt x 2 for COVID- pre-screen. Left message to call back.

## 2019-03-15 NOTE — Telephone Encounter (Signed)
Pt called and rescheduled appointment

## 2019-03-20 ENCOUNTER — Other Ambulatory Visit: Payer: Self-pay | Admitting: Cardiology

## 2019-03-20 ENCOUNTER — Other Ambulatory Visit: Payer: Self-pay | Admitting: *Deleted

## 2019-03-20 MED ORDER — CARVEDILOL 3.125 MG PO TABS: 3.1250 mg | ORAL_TABLET | Freq: Two times a day (BID) | ORAL | 0 refills | Status: DC

## 2019-03-20 NOTE — Telephone Encounter (Signed)
°*  STAT* If patient is at the pharmacy, call can be transferred to refill team.   1. Which medications need to be refilled? (please list name of each medication and dose if known) carvedilol  2. Which pharmacy/location (including street and city if local pharmacy) is medication to be sent to? [facility] [address]   3. Do they need a 30 day or 90 day supply? 90

## 2019-03-20 NOTE — Telephone Encounter (Signed)
Refill sent to pharmacy.   

## 2019-03-21 ENCOUNTER — Encounter: Payer: Self-pay | Admitting: Cardiology

## 2019-03-21 NOTE — Telephone Encounter (Signed)
Error. No note needed  

## 2019-06-02 ENCOUNTER — Telehealth: Payer: Self-pay | Admitting: Cardiology

## 2019-06-02 ENCOUNTER — Telehealth: Payer: Self-pay

## 2019-06-02 NOTE — Telephone Encounter (Signed)
Attempted to contact pt x 2. Left message to call back 

## 2019-06-02 NOTE — Telephone Encounter (Signed)
Called pt to change appointment to virtual visit or reschedule in office visit for another date. Left message to call back.

## 2019-06-05 ENCOUNTER — Telehealth: Payer: Self-pay | Admitting: *Deleted

## 2019-06-05 ENCOUNTER — Ambulatory Visit: Payer: Medicare Other | Admitting: Cardiology

## 2019-06-05 NOTE — Telephone Encounter (Signed)
Called patient to switch to virtual visit and to prepare her for visit but received voicemail every time. LVMTCB after calling three times.

## 2019-06-15 NOTE — Telephone Encounter (Signed)
Open n error °

## 2019-06-19 ENCOUNTER — Telehealth: Payer: Self-pay

## 2019-06-19 NOTE — Telephone Encounter (Signed)
Called pt to go over [place] screening questions. Left message to call back.

## 2019-06-20 ENCOUNTER — Ambulatory Visit (INDEPENDENT_AMBULATORY_CARE_PROVIDER_SITE_OTHER): Payer: Medicare Other | Admitting: Cardiology

## 2019-06-20 ENCOUNTER — Other Ambulatory Visit: Payer: Self-pay

## 2019-06-20 ENCOUNTER — Encounter: Payer: Self-pay | Admitting: Cardiology

## 2019-06-20 VITALS — BP 116/76 | HR 78 | Ht 62.0 in | Wt 174.0 lb

## 2019-06-20 DIAGNOSIS — Z87898 Personal history of other specified conditions: Secondary | ICD-10-CM

## 2019-06-20 DIAGNOSIS — I10 Essential (primary) hypertension: Secondary | ICD-10-CM | POA: Diagnosis not present

## 2019-06-20 DIAGNOSIS — Z8679 Personal history of other diseases of the circulatory system: Secondary | ICD-10-CM

## 2019-06-20 NOTE — Progress Notes (Signed)
Cardiology Office Note:    Date:  06/20/2019   ID:  Cheryl Chase, DOB 1956-06-07, MRN [id number]  PCP:  Kaleen MaskElkins, Wilson Oliver, MD  Cardiologist:  Jodelle RedBridgette Taviana Westergren, MD PhD (prior Dr. Donnie Ahoilley)  Referring MD: Kaleen MaskElkins, Wilson Oliver, *   CC: establish care/prior Dr. Donnie Ahoilley patient  History of Present Illness:    Edman CircleDella Mowery Chase is a 63 y.o. female with a hx of syncope, history of HF/cardiomyopathy per notes with recovered EF, severe arthritis. She is a prior patient of Dr. York Spanielilley's.  Today:  Overall no new concerns today. Reviewed her history as below.  History of syncope: gets sweaty, tunnel vision, has been related to using the bathroom in the past. No recent events. Never found on monitor. Has always had presyncopal symptoms, never without warning.  Reported cardiomyopathy: Reviewed notes from Dr. York Spanielilley's last visit. Prior EF recovered to 50% on echo 02/28/18.  Denies chest pain, shortness of breath at rest or with normal exertion. No PND, orthopnea, LE edema or unexpected weight gain. No recent syncope or palpitations.  Dr. Jeannetta NapElkins follows her bloodwork, reports this was last done in 02/2019. [facility].  ROS positive for rare left arm pain with sleeping, improved with changing position. Never exertional. Doesn't sleep well, only about 2 hours at a time.   Working on improving knees, braces off. Walks around her house 4-5 times, no issues.   Past Medical History:  Diagnosis Date  . Arthritis   . Atherosclerosis 12/24/2014  . Essential hypertension 12/24/2014  . GERD (gastroesophageal reflux disease) 12/24/2014  . Hyperlipidemia 12/24/2014  . Hypertension   . Lumbar disc disease 12/24/2014  . Overweight 12/24/2014    Past Surgical History:  Procedure Laterality Date  . ABDOMINAL HYSTERECTOMY    . LEFT HEART CATHETERIZATION WITH CORONARY ANGIOGRAM N/A 12/27/2014   Procedure: LEFT HEART CATHETERIZATION WITH CORONARY ANGIOGRAM;  Surgeon: Othella BoyerWilliam S Tilley, MD;   [facility];  Service: Cardiovascular;  Laterality: N/A;    Current Medications: Current Outpatient Medications on File Prior to Visit  Medication Sig  . carvedilol (COREG) 3.125 MG tablet Take 1 tablet (3.125 mg total) by mouth 2 (two) times daily.  . Ibuprofen (ADVIL) 200 MG CAPS Take by mouth at bedtime.  Marland Kitchen. rOPINIRole (REQUIP) 2 MG tablet Take 2 mg by mouth at bedtime.  . Sodium Hyaluronate (GENVISC 850) 25 MG/2.5ML SOSY Inject into the articular space every 5 (five) weeks.  Marland Kitchen. spironolactone (ALDACTONE) 25 MG tablet Take 25 mg by mouth daily.  . traMADol (ULTRAM) 50 MG tablet Take 50 mg by mouth every 8 (eight) hours as needed.   No current facility-administered medications on file prior to visit.      Allergies:   Patient has no known allergies.   Social History   Socioeconomic History  . Marital status: Divorced    Spouse name: Not on file  . Number of children: Not on file  . Years of education: Not on file  . Highest education level: Not on file  Occupational History  . Not on file  Social Needs  . Financial resource strain: Not on file  . Food insecurity    Worry: Not on file    Inability: Not on file  . Transportation needs    Medical: Not on file    Non-medical: Not on file  Tobacco Use  . Smoking status: Never Smoker  . Smokeless tobacco: Current User    Types: Snuff  Substance and Sexual Activity  .  Alcohol use: Yes  . Drug use: No  . Sexual activity: Not on file  Lifestyle  . Physical activity    Days per week: Not on file    Minutes per session: Not on file  . Stress: Not on file  Relationships  . Social Musicianconnections    Talks on phone: Not on file    Gets together: Not on file    Attends religious service: Not on file    Active member of club or organization: Not on file    Attends meetings of clubs or organizations: Not on file    Relationship status: Not on file  Other Topics Concern  . Not on file  Social History Narrative  . Not  on file     Family History: Sisters: atherosclerosis, pacemaker, diabetes, hypertension  ROS:   Please see the history of present illness.  Additional pertinent ROS:  Constitutional: Negative for chills, fever, night sweats, unintentional weight loss  HENT: Negative for ear pain and hearing loss.   Eyes: Negative for loss of vision and eye pain.  Respiratory: Negative for cough, sputum, shortness of breath, wheezing.   Cardiovascular: See HPI. Gastrointestinal: Negative for abdominal pain, melena, and hematochezia.  Genitourinary: Negative for dysuria and hematuria.  Musculoskeletal: Negative for falls and myalgias.  Skin: Negative for itching and rash.  Neurological: Negative for focal weakness, focal sensory changes and loss of consciousness.  Endo/Heme/Allergies: Does not bruise/bleed easily.    EKGs/Labs/Other Studies Reviewed:    The following studies were reviewed today: Notes from Dr. Donnie Ahoilley: Cath 12/27/14: no significant CAD, EF 40-45%. Mild aneurysmal dilation of the pRCA. Nuclear study 12/20/14: EF 44%, possible PL ischemia Echo 02/28/18: mild cLVH, no RWMA, EF 50%. No significant valve disease Event monitor  EKG:  EKG is not available  Recent Labs: No results found for requested labs within last 8760 hours.  Recent Lipid Panel    Component Value Date/Time   CHOL 128 03/11/2011 2027   TRIG 85 03/11/2011 2027   HDL 42 03/11/2011 2027   CHOLHDL 3.0 Ratio 03/11/2011 2027   VLDL 17 03/11/2011 2027   LDLCALC 69 03/11/2011 2027    Physical Exam:    VS:  BP 116/76   Pulse 78   Ht 5\' 2"  (1.575 m)   Wt 174 lb (78.9 kg)   BMI 31.83 kg/m     Wt Readings from Last 3 Encounters:  06/20/19 174 lb (78.9 kg)     GEN: Well nourished, well developed in no acute distress HEENT: Normal NECK: No JVD; No carotid bruits LYMPHATICS: No lymphadenopathy CARDIAC: regular rhythm, normal S1 and S2, no murmurs, rubs, gallops. Radial and DP pulses 2+ bilaterally. RESPIRATORY:   Clear to auscultation without rales, wheezing or rhonchi  ABDOMEN: Soft, non-tender, non-distended MUSCULOSKELETAL:  No edema; No deformity  SKIN: Warm and dry NEUROLOGIC:  Alert and oriented x 3 PSYCHIATRIC:  Normal affect   ASSESSMENT:    No diagnosis found. PLAN:    History of syncope: no recent events, unremarkable workup. Never occurred without prodrome. Monitor  Reported history of cardiomyopathy: asymptomatic, most recent EF 50% per Dr. York Spanielilley's notes, no CAD on cath -continue carvedilol, spironolactone  Hypertension: well controlled today -continue carvedilol, spironolactone  Cardiac risk counseling and prevention recommendations: -recommend heart healthy/Mediterranean diet, with whole grains, fruits, vegetable, fish, lean meats, nuts, and olive oil. Limit salt. -recommend moderate walking, 3-5 times/week for 30-50 minutes each session. Aim for at least 150 minutes.week. Goal should be pace of  3 miles/hours, or walking 1.5 miles in 30 minutes -recommend avoidance of tobacco products. Avoid excess alcohol.  Plan for follow up: 1 year or sooner PRN  Medication Adjustments/Labs and Tests Ordered: Current medicines are reviewed at length with the patient today.  Concerns regarding medicines are outlined above.  No orders of the defined types were placed in this encounter.  No orders of the defined types were placed in this encounter.   Patient Instructions  Medication Instructions:  Your Physician recommend you continue on your current medication as directed.     If you need a refill on your cardiac medications before your next appointment, please call your pharmacy.   Lab work: None  Testing/Procedures: None  Follow-Up: At [facility], you and your health needs are our priority.  As part of our continuing mission to provide you with exceptional heart care, we have created designated Provider Care Teams.  These Care Teams include your primary Cardiologist  (physician) and Advanced Practice Providers (APPs -  Physician Assistants and Nurse Practitioners) who all work together to provide you with the care you need, when you need it. You will need a follow up appointment in 1 years.  Please call our office 2 months in advance to schedule this appointment.  You may see Dr. Harrell Gave or one of the following Advanced Practice Providers on your designated Care Team:   Rosaria Ferries, PA-C . Jory Sims, DNP, ANP        Signed, Buford Dresser, MD PhD 06/20/2019 6:29 PM    [place]

## 2019-06-20 NOTE — Patient Instructions (Signed)

## 2019-06-21 ENCOUNTER — Other Ambulatory Visit: Payer: Self-pay | Admitting: Cardiology

## 2020-02-06 ENCOUNTER — Other Ambulatory Visit: Payer: Self-pay | Admitting: Family Medicine

## 2020-02-06 DIAGNOSIS — Z1231 Encounter for screening mammogram for malignant neoplasm of breast: Secondary | ICD-10-CM

## 2020-02-18 ENCOUNTER — Ambulatory Visit: Payer: Medicare Other | Attending: Internal Medicine

## 2020-02-18 DIAGNOSIS — Z23 Encounter for immunization: Secondary | ICD-10-CM | POA: Insufficient documentation

## 2020-02-18 NOTE — Progress Notes (Signed)
   Covid-19 Vaccination Clinic  Name:  Cheryl Chase    MRN: [id number] DOB: 1956/03/07  02/18/2020  [name] was observed post Covid-19 immunization for 15 minutes without incident. She was provided with Vaccine Information Sheet and instruction to access the V-Safe system.   [name] was instructed to call 911 with any severe reactions post vaccine: Marland Kitchen Difficulty breathing  . Swelling of face and throat  . A fast heartbeat  . A bad rash all over body  . Dizziness and weakness   Immunizations Administered    Name Date Dose VIS Date Route   Pfizer COVID-19 Vaccine 02/18/2020  8:48 AM 0.3 mL 11/24/2019 Intramuscular   Manufacturer: [employer], [employer]   Lot: [id number]   NDC: [address]-4

## 2020-03-12 ENCOUNTER — Other Ambulatory Visit: Payer: Self-pay

## 2020-03-12 ENCOUNTER — Ambulatory Visit
Admission: RE | Admit: 2020-03-12 | Discharge: 2020-03-12 | Disposition: A | Discharge status: Home / Self Care | Payer: Medicare Other | Source: Ambulatory Visit | Attending: Family Medicine | Admitting: Family Medicine

## 2020-03-12 DIAGNOSIS — Z1231 Encounter for screening mammogram for malignant neoplasm of breast: Secondary | ICD-10-CM

## 2020-03-19 ENCOUNTER — Ambulatory Visit: Payer: Medicare Other | Attending: Internal Medicine

## 2020-03-19 DIAGNOSIS — Z23 Encounter for immunization: Secondary | ICD-10-CM

## 2020-03-19 NOTE — Progress Notes (Signed)
   Covid-19 Vaccination Clinic  Name:  Cheryl Chase    MRN: [id number] DOB: Jan 07, 1956  03/19/2020  [name] was observed post Covid-19 immunization for 15 minutes without incident. She was provided with Vaccine Information Sheet and instruction to access the V-Safe system.   [name] was instructed to call 911 with any severe reactions post vaccine: Marland Kitchen Difficulty breathing  . Swelling of face and throat  . A fast heartbeat  . A bad rash all over body  . Dizziness and weakness   Immunizations Administered    Name Date Dose VIS Date Route   Pfizer COVID-19 Vaccine 03/19/2020  9:31 AM 0.3 mL 11/24/2019 Intramuscular   Manufacturer: [employer], [employer]   Lot: [id number]   NDC: [address]-4

## 2020-06-16 ENCOUNTER — Other Ambulatory Visit: Payer: Self-pay | Admitting: Cardiology

## 2020-06-20 ENCOUNTER — Ambulatory Visit (INDEPENDENT_AMBULATORY_CARE_PROVIDER_SITE_OTHER): Payer: Medicare Other | Admitting: Cardiology

## 2020-06-20 ENCOUNTER — Other Ambulatory Visit: Payer: Self-pay

## 2020-06-20 ENCOUNTER — Encounter: Payer: Self-pay | Admitting: Cardiology

## 2020-06-20 VITALS — BP 144/84 | HR 43 | Temp 97.2°F | Ht 60.0 in | Wt 179.8 lb

## 2020-06-20 DIAGNOSIS — Z8679 Personal history of other diseases of the circulatory system: Secondary | ICD-10-CM

## 2020-06-20 DIAGNOSIS — R001 Bradycardia, unspecified: Secondary | ICD-10-CM | POA: Diagnosis not present

## 2020-06-20 DIAGNOSIS — Z7189 Other specified counseling: Secondary | ICD-10-CM

## 2020-06-20 DIAGNOSIS — Z87898 Personal history of other specified conditions: Secondary | ICD-10-CM

## 2020-06-20 DIAGNOSIS — I10 Essential (primary) hypertension: Secondary | ICD-10-CM

## 2020-06-20 NOTE — Patient Instructions (Signed)
Medication Instructions:  Stop Carvedilol 3.125 mg twice a day   *If you need a refill on your cardiac medications before your next appointment, please call your pharmacy*   Lab Work: None  Testing/Procedures: None   Follow-Up: At [facility], you and your health needs are our priority.  As part of our continuing mission to provide you with exceptional heart care, we have created designated Provider Care Teams.  These Care Teams include your primary Cardiologist (physician) and Advanced Practice Providers (APPs -  Physician Assistants and Nurse Practitioners) who all work together to provide you with the care you need, when you need it.  We recommend signing up for the patient portal called "MyChart".  Sign up information is provided on this After Visit Summary.  MyChart is used to connect with patients for Virtual Visits (Telemedicine).  Patients are able to view lab/test results, encounter notes, upcoming appointments, etc.  Non-urgent messages can be sent to your provider as well.   To learn more about what you can do with MyChart, go to [email or web address].    Your next appointment:   1 month(s)  The format for your next appointment:   In Person  Provider:   Jodelle Red, MD  Monitor blood pressure and heart rate daily. Please bring blood pressure machine to next visit.

## 2020-06-20 NOTE — Progress Notes (Signed)
Cardiology Office Note:    Date:  06/20/2020   ID:  Cheryl Chase, Cheryl Chase 06/19/1956, MRN [id number]  PCP:  Kaleen Mask, MD  Cardiologist:  Jodelle Red, MD PhD (prior Dr. Donnie Aho)  Referring MD: Kaleen Mask, *   CC: follow up  History of Present Illness:    Cheryl Chase is a 64 y.o. female with a hx of syncope, history of HF/cardiomyopathy per notes with recovered EF, severe arthritis. She is a prior patient of Dr. York Spaniel.  Today: Bilateral knee braces back on, but doing well with this. Notes intermittent left arm numbness with certain positions, improves with repositioning. Not exertional, no associated symptoms. Usually occurs while lying in bed.  Has lost weight using green tea, believes she has lost ~15 lbs. Had labs done with Dr. Jeannetta Nap 1 week ago, told it was normal.  Checks blood pressure rarely at home. At home numbers run more 110s systolic. Prior visit HR was 78, today it is 44.  Denies chest pain, shortness of breath at rest or with normal exertion. No PND, orthopnea, LE edema or unexpected weight gain. No syncope recently, no palpitations.  ROS positive for generally heavy sweating, any time of day.  Past Medical History:  Diagnosis Date  . Arthritis   . Atherosclerosis 12/24/2014  . Essential hypertension 12/24/2014  . GERD (gastroesophageal reflux disease) 12/24/2014  . Hyperlipidemia 12/24/2014  . Hypertension   . Lumbar disc disease 12/24/2014  . Overweight 12/24/2014    Past Surgical History:  Procedure Laterality Date  . ABDOMINAL HYSTERECTOMY    . LEFT HEART CATHETERIZATION WITH CORONARY ANGIOGRAM N/A 12/27/2014   Procedure: LEFT HEART CATHETERIZATION WITH CORONARY ANGIOGRAM;  Surgeon: Othella Boyer, MD;  Location: [facility] CATH LAB;  Service: Cardiovascular;  Laterality: N/A;    Current Medications: Current Outpatient Medications on File Prior to Visit  Medication Sig  . carvedilol (COREG) 3.125 MG tablet Take 1 tablet by  mouth twice daily  . Ibuprofen (ADVIL) 200 MG CAPS Take by mouth at bedtime.  Marland Kitchen rOPINIRole (REQUIP) 2 MG tablet Take 2 mg by mouth at bedtime.  . Sodium Hyaluronate (GENVISC 850) 25 MG/2.5ML SOSY Inject into the articular space every 5 (five) weeks.  Marland Kitchen spironolactone (ALDACTONE) 25 MG tablet Take 25 mg by mouth daily.  . traMADol (ULTRAM) 50 MG tablet Take 50 mg by mouth every 8 (eight) hours as needed.   No current facility-administered medications on file prior to visit.     Allergies:   Patient has no known allergies.   Social History   Tobacco Use  . Smoking status: Never Smoker  . Smokeless tobacco: Current User    Types: Snuff  Substance Use Topics  . Alcohol use: Yes  . Drug use: No    Family History: Sisters: atherosclerosis, pacemaker, diabetes, hypertension  ROS:   Please see the history of present illness.  Additional pertinent ROS otherwise unremarkable.  EKGs/Labs/Other Studies Reviewed:    The following studies were reviewed today: Notes from Dr. Donnie Aho: Cath 12/27/14: no significant CAD, EF 40-45%. Mild aneurysmal dilation of the pRCA. Nuclear study 12/20/14: EF 44%, possible PL ischemia Echo 02/28/18: mild cLVH, no RWMA, EF 50%. No significant valve disease Event monitor  EKG:  EKG is personally reviewed today. The EKG from today shows sinus bradycardia at 44 bpm, nonspecific st pattern  Recent Labs: No results found for requested labs within last 8760 hours.  Recent Lipid Panel    Component Value Date/Time   CHOL  128 03/11/2011 2027   TRIG 85 03/11/2011 2027   HDL 42 03/11/2011 2027   CHOLHDL 3.0 Ratio 03/11/2011 2027   VLDL 17 03/11/2011 2027   LDLCALC 69 03/11/2011 2027    Physical Exam:    VS:  BP (!) 150/80   Pulse (!) 43   Temp (!) 97.2 F (36.2 C)   Ht 5' (1.524 m)   Wt 179 lb 12.8 oz (81.6 kg)   SpO2 95%   BMI 35.11 kg/m     Wt Readings from Last 3 Encounters:  06/20/20 179 lb 12.8 oz (81.6 kg)  06/20/19 174 lb (78.9 kg)    GEN:  Well nourished, well developed in no acute distress HEENT: Normal, moist mucous membranes NECK: No JVD CARDIAC: regular rhythm, normal S1 and S2, no rubs or gallops. No murmur. VASCULAR: Radial and DP pulses 2+ bilaterally. No carotid bruits RESPIRATORY:  Clear to auscultation without rales, wheezing or rhonchi  ABDOMEN: Soft, non-tender, non-distended MUSCULOSKELETAL:  Ambulates independently with bilateral leg braces SKIN: Warm and dry, no edema NEUROLOGIC:  Alert and oriented x 3. No focal neuro deficits noted. PSYCHIATRIC:  Normal affect   ASSESSMENT:    1. Bradycardia, sinus   2. Essential hypertension   3. History of syncope   4. History of cardiomyopathy   5. Cardiac risk counseling   6. Counseling on health promotion and disease prevention    PLAN:    Sinus bradycardia History of syncope -stopping carvedilol today. No syncope, but HR today is 44 bpm. -follow home heart rate/blood pressure numbers  Reported history of cardiomyopathy: asymptomatic, most recent EF 50% per Dr. York Spaniel notes, no CAD on cath -continue carvedilol, spironolactone  Hypertension: elevated today, but reports good control at home -stopping carvedilol due to bradycardia, as above -monitor home BP and HR closely, will follow up to make sure she is tolerating med changes -continue spironolactone  Cardiac risk counseling and prevention recommendations: -recommend heart healthy/Mediterranean diet, with whole grains, fruits, vegetable, fish, lean meats, nuts, and olive oil. Limit salt. -recommend moderate walking, 3-5 times/week for 30-50 minutes each session. Aim for at least 150 minutes.week. Goal should be pace of 3 miles/hours, or walking 1.5 miles in 30 minutes -recommend avoidance of tobacco products. Avoid excess alcohol.  Plan for follow up: 1 mos to review heart rate and blood pressure with med changes  Medication Adjustments/Labs and Tests Ordered: Current medicines are reviewed at length  with the patient today.  Concerns regarding medicines are outlined above.  Orders Placed This Encounter  Procedures  . EKG 12-Lead   No orders of the defined types were placed in this encounter.   Patient Instructions  Medication Instructions:  Stop Carvedilol 3.125 mg twice a day   *If you need a refill on your cardiac medications before your next appointment, please call your pharmacy*   Lab Work: None  Testing/Procedures: None   Follow-Up: At [facility], you and your health needs are our priority.  As part of our continuing mission to provide you with exceptional heart care, we have created designated Provider Care Teams.  These Care Teams include your primary Cardiologist (physician) and Advanced Practice Providers (APPs -  Physician Assistants and Nurse Practitioners) who all work together to provide you with the care you need, when you need it.  We recommend signing up for the patient portal called "MyChart".  Sign up information is provided on this After Visit Summary.  MyChart is used to connect with patients for Virtual Visits (  Telemedicine).  Patients are able to view lab/test results, encounter notes, upcoming appointments, etc.  Non-urgent messages can be sent to your provider as well.   To learn more about what you can do with MyChart, go to [email or web address].    Your next appointment:   1 month(s)  The format for your next appointment:   In Person  Provider:   Jodelle Red, MD  Monitor blood pressure and heart rate daily. Please bring blood pressure machine to next visit.      Signed, Jodelle Red, MD PhD 06/20/2020  [facility]

## 2020-07-26 ENCOUNTER — Ambulatory Visit (INDEPENDENT_AMBULATORY_CARE_PROVIDER_SITE_OTHER): Payer: Medicare Other | Admitting: Cardiology

## 2020-07-26 ENCOUNTER — Encounter: Payer: Self-pay | Admitting: Cardiology

## 2020-07-26 ENCOUNTER — Other Ambulatory Visit: Payer: Self-pay

## 2020-07-26 VITALS — BP 136/90 | HR 58 | Temp 97.2°F | Ht 62.0 in | Wt 173.0 lb

## 2020-07-26 DIAGNOSIS — Z7189 Other specified counseling: Secondary | ICD-10-CM

## 2020-07-26 DIAGNOSIS — Z8679 Personal history of other diseases of the circulatory system: Secondary | ICD-10-CM

## 2020-07-26 DIAGNOSIS — I10 Essential (primary) hypertension: Secondary | ICD-10-CM | POA: Diagnosis not present

## 2020-07-26 DIAGNOSIS — I7090 Unspecified atherosclerosis: Secondary | ICD-10-CM | POA: Diagnosis not present

## 2020-07-26 DIAGNOSIS — E785 Hyperlipidemia, unspecified: Secondary | ICD-10-CM

## 2020-07-26 DIAGNOSIS — Z79899 Other long term (current) drug therapy: Secondary | ICD-10-CM

## 2020-07-26 LAB — CBC
Hematocrit: 41.1 % (ref 34.0–46.6)
Hemoglobin: 13.7 g/dL (ref 11.1–15.9)
MCH: 28.1 pg (ref 26.6–33.0)
MCHC: 33.3 g/dL (ref 31.5–35.7)
MCV: 84 fL (ref 79–97)
Platelets: 224 10*3/uL (ref 150–450)
RBC: 4.87 x10E6/uL (ref 3.77–5.28)
RDW: 13 % (ref 11.7–15.4)
WBC: 7.9 10*3/uL (ref 3.4–10.8)

## 2020-07-26 LAB — BASIC METABOLIC PANEL
BUN/Creatinine Ratio: 18 (ref 12–28)
BUN: 16 mg/dL (ref 8–27)
CO2: 27 mmol/L (ref 20–29)
Calcium: 10.4 mg/dL — ABNORMAL HIGH (ref 8.7–10.3)
Chloride: 103 mmol/L (ref 96–106)
Creatinine, Ser: 0.89 mg/dL (ref 0.57–1.00)
GFR calc Af Amer: 78 mL/min/{1.73_m2} (ref >59)
GFR calc non Af Amer: 68 mL/min/{1.73_m2} (ref >59)
Glucose: 104 mg/dL — ABNORMAL HIGH (ref 65–99)
Potassium: 4.5 mmol/L (ref 3.5–5.2)
Sodium: 141 mmol/L (ref 134–144)

## 2020-07-26 MED ORDER — SPIRONOLACTONE 25 MG PO TABS: 12.5000 mg | ORAL_TABLET | Freq: Every day | ORAL | 3 refills | Status: DC

## 2020-07-26 MED ORDER — ATORVASTATIN CALCIUM 10 MG PO TABS: 10.0000 mg | ORAL_TABLET | Freq: Every day | ORAL | 3 refills | Status: DC

## 2020-07-26 MED ORDER — AMLODIPINE BESYLATE 2.5 MG PO TABS: 2.5000 mg | ORAL_TABLET | Freq: Every day | ORAL | 3 refills | Status: DC

## 2020-07-26 NOTE — Patient Instructions (Signed)
Medication Instructions:  Start Amlodipine 2.5 mg daily  *If you need a refill on your cardiac medications before your next appointment, please call your pharmacy*   Lab Work: Your physician recommends that you return for lab work today ( CBC, BMP)  If you have labs (blood work) drawn today and your tests are completely normal, you will receive your results only by: Marland Kitchen MyChart Message (if you have MyChart) OR . A paper copy in the mail If you have any lab test that is abnormal or we need to change your treatment, we will call you to review the results.   Testing/Procedures: None   Follow-Up: At [facility], you and your health needs are our priority.  As part of our continuing mission to provide you with exceptional heart care, we have created designated Provider Care Teams.  These Care Teams include your primary Cardiologist (physician) and Advanced Practice Providers (APPs -  Physician Assistants and Nurse Practitioners) who all work together to provide you with the care you need, when you need it.  We recommend signing up for the patient portal called "MyChart".  Sign up information is provided on this After Visit Summary.  MyChart is used to connect with patients for Virtual Visits (Telemedicine).  Patients are able to view lab/test results, encounter notes, upcoming appointments, etc.  Non-urgent messages can be sent to your provider as well.   To learn more about what you can do with MyChart, go to [email or web address].    Your next appointment:   6 month(s)  The format for your next appointment:   In Person  Provider:   Jodelle Red, MD

## 2020-07-26 NOTE — Progress Notes (Signed)
Cardiology Office Note:    Date:  07/26/2020   ID:  Cheryl Chase, DOB [birth year], MRN [id number]  PCP:  Kaleen Mask, MD  Cardiologist:  Jodelle Red, MD PhD (prior Dr. Donnie Chase)  Referring MD: Kaleen Mask, *   CC: follow up  History of Present Illness:    Cheryl Chase is a 64 y.o. female with a hx of syncope, history of HF/cardiomyopathy, hyperlipidemia, atherosclerosis per notes with recovered EF, severe arthritis. She is a prior patient of Dr. York Chase.  Today: Still with knee pain, braces back on today. Has cold sweats about 2x/week, cold washcloth and laying down helps. Feels lightheaded with these, but no chest pain or shortness of breath. Usually after she has been outside in the heat.  Brings a log of her blood pressure today. Range is largely 120s/70s-140s/80s, HR 51-74. Had one reading in the 150s systolic recently. Stopped carvedilol due to bradycardia. BP has crept up slightly (more 130s-140s systolic). Did full medication reconciliation, spironolactone dose is 12.5 mg daily.  Denies chest pain, shortness of breath at rest or with normal exertion. No PND, orthopnea, LE edema or unexpected weight gain. No syncope. Feels some palpitations with walking fast in her yard.    Past Medical History:  Diagnosis Date  . Arthritis   . Atherosclerosis 12/24/2014  . Essential hypertension 12/24/2014  . GERD (gastroesophageal reflux disease) 12/24/2014  . Hyperlipidemia 12/24/2014  . Hypertension   . Lumbar disc disease 12/24/2014  . Overweight 12/24/2014    Past Surgical History:  Procedure Laterality Date  . ABDOMINAL HYSTERECTOMY    . LEFT HEART CATHETERIZATION WITH CORONARY ANGIOGRAM N/A 12/27/2014   Procedure: LEFT HEART CATHETERIZATION WITH CORONARY ANGIOGRAM;  Surgeon: Othella Boyer, MD;  Location: [facility] CATH LAB;  Service: Cardiovascular;  Laterality: N/A;    Current Medications: Current Outpatient Medications on File Prior to Visit   Medication Sig  . atorvastatin (LIPITOR) 10 MG tablet Take 10 mg by mouth daily.  . Ibuprofen (ADVIL) 200 MG CAPS Take by mouth at bedtime.  Marland Kitchen nystatin ointment (MYCOSTATIN) SMARTSIG:Sparingly Topical  . rOPINIRole (REQUIP) 2 MG tablet Take 2 mg by mouth at bedtime.  . Sodium Hyaluronate (GENVISC 850) 25 MG/2.5ML SOSY Inject into the articular space every 5 (five) weeks.  Marland Kitchen spironolactone (ALDACTONE) 25 MG tablet Take 25 mg by mouth daily.  . traMADol (ULTRAM) 50 MG tablet Take 50 mg by mouth every 8 (eight) hours as needed.  . triamcinolone ointment (KENALOG) 0.1 % SMARTSIG:Sparingly Topical Every 12 Hours   No current facility-administered medications on file prior to visit.     Allergies:   Patient has no known allergies.   Social History   Tobacco Use  . Smoking status: Never Smoker  . Smokeless tobacco: Current User    Types: Snuff  Substance Use Topics  . Alcohol use: Yes  . Drug use: No    Family History: Sisters: atherosclerosis, pacemaker, diabetes, hypertension  ROS:   Please see the history of present illness.  Additional pertinent ROS otherwise unremarkable  EKGs/Labs/Other Studies Reviewed:    The following studies were reviewed today: Notes from Dr. Donnie Chase: Cath 12/27/14: no significant CAD, EF 40-45%. Mild aneurysmal dilation of the pRCA. Nuclear study 12/20/14: EF 44%, possible PL ischemia Echo 02/28/18: mild cLVH, no RWMA, EF 50%. No significant valve disease Event monitor  EKG:  ECG personally reviewed today. ECG from 06/20/20 shows sinus bradycardia at 44 bpm with nonspecific ST changes  Recent Labs: No results  found for requested labs within last 8760 hours.  Recent Lipid Panel    Component Value Date/Time   CHOL 128 03/11/2011 2027   TRIG 85 03/11/2011 2027   HDL 42 03/11/2011 2027   CHOLHDL 3.0 Ratio 03/11/2011 2027   VLDL 17 03/11/2011 2027   LDLCALC 69 03/11/2011 2027    Physical Exam:    VS:  BP 136/90   Pulse (!) 58   Temp (!) 97.2 F  (36.2 C)   Ht 5\' 2"  (1.575 m)   Wt 173 lb (78.5 kg)   SpO2 97%   BMI 31.64 kg/m     Wt Readings from Last 3 Encounters:  07/26/20 173 lb (78.5 kg)  06/20/20 179 lb 12.8 oz (81.6 kg)  06/20/19 174 lb (78.9 kg)    GEN: Well nourished, well developed in no acute distress HEENT: Normal, moist mucous membranes NECK: No JVD CARDIAC: regular rhythm, normal S1 and S2, no rubs or gallops. No murmur. VASCULAR: Radial and DP pulses 2+ bilaterally. No carotid bruits RESPIRATORY:  Clear to auscultation without rales, wheezing or rhonchi  ABDOMEN: Soft, non-tender, non-distended MUSCULOSKELETAL:  Ambulates independently with assistive devices SKIN: Warm and dry, no edema NEUROLOGIC:  Alert and oriented x 3. No focal neuro deficits noted. PSYCHIATRIC:  Normal affect   ASSESSMENT:    1. Essential hypertension   2. History of cardiomyopathy   3. Atherosclerosis   4. Hyperlipidemia, unspecified hyperlipidemia type   5. Medication management   6. Cardiac risk counseling   7. Counseling on health promotion and disease prevention    PLAN:    Sinus bradycardia History of syncope -heart rate now 58 off carvedilol -no further syncope -follow home heart rate/blood pressure numbers  Reported history of cardiomyopathy: asymptomatic, most recent EF 50% per Dr. 08/21/19 notes, no CAD on cath -continue spironolactone  Hypertension: near goal with some elevated numbers -no beta blockers due to bradycardia -start amlodipine 2.5 mg daily -continue spironolactone  Aortic atherosclerosis on chest x-ray 2015 Hyperlipidemia -discussed guidelines again today. She is amenable to starting atorvastatin. Check lipids, LFTs after start  Cardiac risk counseling and prevention recommendations: -recommend heart healthy/Mediterranean diet, with whole grains, fruits, vegetable, fish, lean meats, nuts, and olive oil. Limit salt. -recommend moderate walking, 3-5 times/week for 30-50 minutes each session.  Aim for at least 150 minutes.week. Goal should be pace of 3 miles/hours, or walking 1.5 miles in 30 minutes -recommend avoidance of tobacco products. Avoid excess alcohol.  Plan for follow up: 6 mos or sooner PRN  Medication Adjustments/Labs and Tests Ordered: Current medicines are reviewed at length with the patient today.  Concerns regarding medicines are outlined above.  Orders Placed This Encounter  Procedures  . Basic metabolic panel  . CBC   Meds ordered this encounter  Medications  . amLODipine (NORVASC) 2.5 MG tablet    Sig: Take 1 tablet (2.5 mg total) by mouth daily.    Dispense:  90 tablet    Refill:  3  . spironolactone (ALDACTONE) 25 MG tablet    Sig: Take 0.5 tablets (12.5 mg total) by mouth daily.    Dispense:  45 tablet    Refill:  3  . atorvastatin (LIPITOR) 10 MG tablet    Sig: Take 1 tablet (10 mg total) by mouth daily.    Dispense:  90 tablet    Refill:  3    Patient Instructions  Medication Instructions:  Start Amlodipine 2.5 mg daily  *If you need a refill on your  cardiac medications before your next appointment, please call your pharmacy*   Lab Work: Your physician recommends that you return for lab work today ( CBC, BMP)  If you have labs (blood work) drawn today and your tests are completely normal, you will receive your results only by: Marland Kitchen MyChart Message (if you have MyChart) OR . A paper copy in the mail If you have any lab test that is abnormal or we need to change your treatment, we will call you to review the results.   Testing/Procedures: None   Follow-Up: At [facility], you and your health needs are our priority.  As part of our continuing mission to provide you with exceptional heart care, we have created designated Provider Care Teams.  These Care Teams include your primary Cardiologist (physician) and Advanced Practice Providers (APPs -  Physician Assistants and Nurse Practitioners) who all work together to provide you with the  care you need, when you need it.  We recommend signing up for the patient portal called "MyChart".  Sign up information is provided on this After Visit Summary.  MyChart is used to connect with patients for Virtual Visits (Telemedicine).  Patients are able to view lab/test results, encounter notes, upcoming appointments, etc.  Non-urgent messages can be sent to your provider as well.   To learn more about what you can do with MyChart, go to [email or web address].    Your next appointment:   6 month(s)  The format for your next appointment:   In Person  Provider:   Jodelle Red, MD       Signed, Jodelle Red, MD PhD 07/26/2020    [facility]

## 2020-09-22 ENCOUNTER — Encounter: Payer: Self-pay | Admitting: Cardiology

## 2020-10-02 ENCOUNTER — Telehealth: Payer: Self-pay | Admitting: Cardiology

## 2020-10-02 NOTE — Telephone Encounter (Signed)
STAT if HR is under 50 or over 120 (normal HR is 60-100 beats per minute)  1) What is your heart rate? 35  2) Do you have a log of your heart rate readings (document readings)? 58 in august, 43 in July  3) Do you have any other symptoms? stays tired all the time  [place], Charity fundraiser, from [employer] is currently with the patient. She states her HR has dropped to 35.

## 2020-10-02 NOTE — Telephone Encounter (Signed)
Spoke with Mathis Dad, NP regarding this pt. During her appt with pt today, heart rate was noted to be 35. NP states that she has noticed a trend of decreasing HR readings, with HR 43 in July and 58 in August at the pt's last appt with Dr. Cristal Deer. NP reports that pt complains of feeling fatigued, but is otherwise asymptomatic. Blood pressure at that time reported to be 120/70. Informed NP that information would be forwarded to Dr. Cristal Deer.

## 2020-10-08 NOTE — Telephone Encounter (Signed)
No changes needed--not on any medications that slow heart rate, and she has been noted to have intermittent low blood pressures in the past. Just for [facility]

## 2020-10-09 NOTE — Telephone Encounter (Signed)
Left message to call back  

## 2020-10-15 NOTE — Telephone Encounter (Signed)
Left message to call back  

## 2020-10-15 NOTE — Telephone Encounter (Signed)
Wilkie Aye notified and verbalized understanding.

## 2020-11-12 ENCOUNTER — Telehealth: Payer: Self-pay

## 2020-11-12 NOTE — Telephone Encounter (Signed)
   [facility] Pre-operative Risk Assessment    Dr. Andree Elk  Request for surgical clearance:  1. What type of surgery is being performed? Left total knee replacement   2. When is this surgery scheduled? TBD   3. What type of clearance is required (medical clearance vs. Pharmacy clearance to hold med vs. Both)? Medical  4. Are there any medications that need to be held prior to surgery and how long? None requested  5. Practice name and name of physician performing surgery? Edmonia Lynch, MD   6. What is the office phone number? [place]   7.   What is the office fax number? [phone]  8.   Anesthesia type (None, local, MAC, general) ? Not specified   Cheryl Chase Apple 11/12/2020, 4:49 PM  _________________________________________________________________   (provider comments below)

## 2020-11-13 NOTE — Telephone Encounter (Signed)
Unable to leave VM

## 2020-11-18 NOTE — Telephone Encounter (Signed)
   Primary Cardiologist: Jodelle Red, MD  Chart reviewed as part of pre-operative protocol coverage. Patient was last seen by Dr. Cristal Deer in 07/2020 at which time she was doing well from a cardiac standpoint without any chest pain. I called and spoke with patient today. She reported intermittent chest pain over the last month. Occurs at rest and not worsened with activity. Patient states it felt like reflux but would not elaborate much more than that. She does have a history of reflux. She sounded very tearful on the phone. Given new chest pain, I recommended an office visit for further evaluation prior to surgery. Patient was agreeable to this.  Pre-op covering staff: - Please schedule appointment and call patient to inform them. If patient already had an upcoming appointment within acceptable timeframe, please add "pre-op clearance" to the appointment notes so provider is aware. - Please contact requesting surgeon's office via preferred method (i.e, phone, fax) to inform them of need for appointment prior to surgery.  Corrin Parker, PA-C  11/18/2020, 2:40 PM

## 2020-11-18 NOTE — Telephone Encounter (Signed)
Need appt with Dr Cristal Deer or App for pre op clearance

## 2020-11-19 ENCOUNTER — Encounter: Payer: Self-pay | Admitting: Cardiology

## 2020-11-19 ENCOUNTER — Ambulatory Visit (INDEPENDENT_AMBULATORY_CARE_PROVIDER_SITE_OTHER): Payer: Medicare Other | Admitting: Cardiology

## 2020-11-19 ENCOUNTER — Other Ambulatory Visit: Payer: Self-pay

## 2020-11-19 VITALS — BP 120/70 | HR 53 | Ht 62.0 in | Wt 177.0 lb

## 2020-11-19 DIAGNOSIS — I700 Atherosclerosis of aorta: Secondary | ICD-10-CM

## 2020-11-19 DIAGNOSIS — Z8679 Personal history of other diseases of the circulatory system: Secondary | ICD-10-CM

## 2020-11-19 DIAGNOSIS — Z01810 Encounter for preprocedural cardiovascular examination: Secondary | ICD-10-CM

## 2020-11-19 DIAGNOSIS — I10 Essential (primary) hypertension: Secondary | ICD-10-CM

## 2020-11-19 DIAGNOSIS — Z87898 Personal history of other specified conditions: Secondary | ICD-10-CM

## 2020-11-19 NOTE — Progress Notes (Signed)
Cardiology Office Note:    Date:  11/19/2020   ID:  Cheryl, Chase 1956-09-09, MRN [id number]  PCP:  Kaleen Mask, MD  Cardiologist:  Jodelle Red, MD PhD (prior Dr. Donnie Aho)  Referring MD: Kaleen Mask, *   CC: follow up  History of Present Illness:    Cheryl Chase is a 64 y.o. female with a hx of syncope, history of HF/cardiomyopathy, hyperlipidemia, atherosclerosis per notes with recovered EF, severe arthritis. She is a prior patient of Dr. York Spaniel.  Today: Planned surgery: left knee surgery, unknown anesthesia, surgeon Dr. Eulah Pont, date TBD  Pertinent past cardiac history: Prior cardiac workup: heart cath with Dr. Donnie Aho in 2016, as below History of valve disease: none History of CAD/PAD/CVA/TIA: aortic atherosclerosis, on atorvastatin History of heart failure: denies, though unclear report from Dr. Donnie Aho about prior cardiomyopathy. History of arrhythmia: denies On anticoagulation: no History of hypertension: yes, on amlodipine and spironolactone History of diabetes: denies History of CKD: denies history History of OSA: denies history History of anesthesia complications: none Current symptoms: rare indigestion, improves with Tums. Had to stop taking Zantac when it went off the market, and her symptoms started after that. Hasn't tried anything else but tums. Very mild, after eating, no associated symptoms. Denies shortness of breath at rest or with normal exertion. No PND, orthopnea, LE edema or unexpected weight gain. No syncope in a long time, no recent palpitations. Functional capacity: able to climb stairs without issue. Able to clean house, though does slowly.   Brings a BP log today, range 110-130/70s-80s  Past Medical History:  Diagnosis Date   Arthritis    Atherosclerosis 12/24/2014   Essential hypertension 12/24/2014   GERD (gastroesophageal reflux disease) 12/24/2014   Hyperlipidemia 12/24/2014   Hypertension    Lumbar disc  disease 12/24/2014   Overweight 12/24/2014    Past Surgical History:  Procedure Laterality Date   ABDOMINAL HYSTERECTOMY     LEFT HEART CATHETERIZATION WITH CORONARY ANGIOGRAM N/A 12/27/2014   Procedure: LEFT HEART CATHETERIZATION WITH CORONARY ANGIOGRAM;  Surgeon: Othella Boyer, MD;  Location[facility] C[facility] LAB;  Service: Cardiovascular;  Laterality: N/A;    Current Medications: Current Outpatient Medications on File Prior to Visit  Medication Sig   atorvastatin (LIPITOR) 10 MG tablet Take 1 tablet (10 mg total) by mouth daily.   Ibuprofen (ADVIL) 200 MG CAPS Take by mouth at bedtime.   nystatin ointment (MYCOSTATIN) SMARTSIG:Sparingly Topical   rOPINIRole (REQUIP) 2 MG tablet Take 2 mg by mouth at bedtime.   Sodium Hyaluronate (GENVISC 850) 25 MG/2.5ML SOSY Inject into the articular space every 5 (five) weeks.   spironolactone (ALDACTONE) 25 MG tablet Take 0.5 tablets (12.5 mg total) by mouth daily.   traMADol (ULTRAM) 50 MG tablet Take 50 mg by mouth every 8 (eight) hours as needed.   triamcinolone ointment (KENALOG) 0.1 % SMARTSIG:Sparingly Topical Every 12 Hours   amLODipine (NORVASC) 2.5 MG tablet Take 1 tablet (2.5 mg total) by mouth daily.   No current facility-administered medications on file prior to visit.     Allergies:   Patient has no known allergies.   Social History   Tobacco Use   Smoking status: Never Smoker   Smokeless tobacco: Current User    Types: Snuff  Substance Use Topics   Alcohol use: Yes   Drug use: No    Family History: Sisters: atherosclerosis, pacemaker, diabetes, hypertension  ROS:   Please see the history of present illness.  Additional pertinent ROS  otherwise unremarkable  EKGs/Labs/Other Studies Reviewed:    The following studies were reviewed today: Notes from Dr. Donnie Aho: Cath 12/27/14: no significant CAD, EF 40-45%. Mild aneurysmal dilation of the pRCA. Nuclear study 12/20/14: EF 44%, possible PL ischemia Echo 02/28/18: mild cLVH, no  RWMA, EF 50%. No significant valve disease Event monitor  Cath 12/27/2014 ANGIOGRAPHIC DATA:     CORONARY ARTERIES:   Arise and distribute normally.  Right dominant. No coronary calcification is noted.   Left main coronary artery: Normal.   Left anterior descending: Normal.   Circumflex coronary artery: Normal.   Right coronary artery: There is aneurysmal dilation of the proximal portion of the artery with mild irregularity.  Prior to that aneurysmal dilation, but no significant obstructive stenosis is noted.Marland Kitchen   LEFT VENTRICULOGRAM:  Performed in the 30 RAO projection.  The aortic and mitral valves are normal. The left ventricle is normal in size with mild global hypokinesis.  There are increased trabeculations consistent with hypertrophy.  Estimated ejection fraction is 40-45%.   IMPRESSIONS:   No significant obstructive coronary artery disease.  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  EKG:  ECG personally reviewed today. ECG from 11/19/20 shows sinus bradycardia at 53 bpm with nonspecific ST changes and a PVC  Recent Labs: 07/26/2020: BUN 16; Creatinine, Ser 0.89; Hemoglobin 13.7; Platelets 224; Potassium 4.5; Sodium 141  Recent Lipid Panel    Component Value Date/Time   CHOL 128 03/11/2011 2027   TRIG 85 03/11/2011 2027   HDL 42 03/11/2011 2027   CHOLHDL 3.0 Ratio 03/11/2011 2027   VLDL 17 03/11/2011 2027   LDLCALC 69 03/11/2011 2027    Physical Exam:    VS:  BP 120/70    Pulse (!) 53    Ht 5\' 2"  (1.575 m)    Wt 177 lb (80.3 kg)    SpO2 96%    BMI 32.37 kg/m     Wt Readings from Last 3 Encounters:  11/19/20 177 lb (80.3 kg)  07/26/20 173 lb (78.5 kg)  06/20/20 179 lb 12.8 oz (81.6 kg)    GEN: Well nourished, well developed in no acute distress HEENT: Normal, moist mucous  membranes NECK: No JVD CARDIAC: regular rhythm, normal S1 and S2, no rubs or gallops. No murmur. VASCULAR: Radial and DP pulses 2+ bilaterally. No carotid bruits RESPIRATORY:  Clear to auscultation without rales, wheezing or rhonchi  ABDOMEN: Soft, non-tender, non-distended MUSCULOSKELETAL:  Ambulates independently with assistive devices SKIN: Warm and dry, no edema NEUROLOGIC:  Alert and oriented x 3. No focal neuro deficits noted. PSYCHIATRIC:  Normal affect    ASSESSMENT:    1. Preop cardiovascular exam   2. History of cardiomyopathy   3. Essential hypertension   4. History of syncope   5. Aortic atherosclerosis (HCC)    PLAN:    Preoperative cardiovascular exam: Based on available date, patient's RCRI score = 0, which carries a 3.9% 30-day risk of death, MI, or cardiac arrest.  The patient is not currently having active cardiac symptoms, and they can achieve >4 METs of activity.  According to ACC/AHA Guidelines, no further testing is needed.  Proceed with surgery at acceptable risk.  Our service is available as needed in the peri-operative period.     Sinus bradycardia History of syncope -heart rate in the 50s off carvedilol -no further syncope -follow home  heart rate/blood pressure numbers   Reported history of cardiomyopathy: asymptomatic, most recent EF 50% per Dr. York Spaniel notes, no CAD on cath -continue spironolactone   Hypertension: near goal with some elevated numbers -no beta blockers due to bradycardia -continue amlodipine 2.5 mg daily -continue spironolactone   Aortic atherosclerosis on chest x-ray 2015 Hyperlipidemia -continue atorvastatin  Cardiac risk counseling and prevention recommendations: -recommend heart healthy/Mediterranean diet, with whole grains, fruits, vegetable, fish, lean meats, nuts, and olive oil. Limit salt. -recommend moderate walking, 3-5 times/week for 30-50 minutes each session. Aim for at least 150 minutes.week. Goal should be  pace of 3 miles/hours, or walking 1.5 miles in 30 minutes -recommend avoidance of tobacco products. Avoid excess alcohol.  Plan for follow up: 6 mos or sooner PRN  Medication Adjustments/Labs and Tests Ordered: Current medicines are reviewed at length with the patient today.  Concerns regarding medicines are outlined above.  Orders Placed This Encounter  Procedures   EKG 12-Lead   No orders of the defined types were placed in this encounter.   Patient Instructions  Medication Instructions:  Your Physician recommend you continue on your current medication as directed.    *If you need a refill on your cardiac medications before your next appointment, please call your pharmacy*   Lab Work: None   Testing/Procedures: None   Follow-Up: At [facility], you and your health needs are our priority.  As part of our continuing mission to provide you with exceptional heart care, we have created designated Provider Care Teams.  These Care Teams include your primary Cardiologist (physician) and Advanced Practice Providers (APPs -  Physician Assistants and Nurse Practitioners) who all work together to provide you with the care you need, when you need it.  We recommend signing up for the patient portal called "MyChart".  Sign up information is provided on this After Visit Summary.  MyChart is used to connect with patients for Virtual Visits (Telemedicine).  Patients are able to view lab/test results, encounter notes, upcoming appointments, etc.  Non-urgent messages can be sent to your provider as well.   To learn more about what you can do with MyChart, go to [email or web address].    Your next appointment:   6 month(s)  The format for your next appointment:   In Person  Provider:   Jodelle Red, MD   Other Instructions In terms of your heart history: -Dr. Donnie Aho noted a history of cardiomyopathy (abnormal heart squeeze). I do not have all your prior records, but the most  recent note from Dr. Donnie Aho notes an EF of 50%. Normal is 55%-60%, so this is not that abnormal.  -you have a history of high blood pressure. This is well controlled  -you have a history of aortic calcium (atherosclerosis). This is a marker for plaque. This is what the atorvastatin is for.Your heart cath in 2016 did not show any blockages in the vessels that feed the heart.  -you have a history of slow heart rates, but this is better since stopping carvedilol  For blood pressure, top number should be more than 100 but less than 140 ideally.   Signed, Jodelle Red, MD PhD 11/19/2020    [facility]

## 2020-11-19 NOTE — Patient Instructions (Addendum)
Medication Instructions:  Your Physician recommend you continue on your current medication as directed.    *If you need a refill on your cardiac medications before your next appointment, please call your pharmacy*   Lab Work: None   Testing/Procedures: None   Follow-Up: At [facility], you and your health needs are our priority.  As part of our continuing mission to provide you with exceptional heart care, we have created designated Provider Care Teams.  These Care Teams include your primary Cardiologist (physician) and Advanced Practice Providers (APPs -  Physician Assistants and Nurse Practitioners) who all work together to provide you with the care you need, when you need it.  We recommend signing up for the patient portal called "MyChart".  Sign up information is provided on this After Visit Summary.  MyChart is used to connect with patients for Virtual Visits (Telemedicine).  Patients are able to view lab/test results, encounter notes, upcoming appointments, etc.  Non-urgent messages can be sent to your provider as well.   To learn more about what you can do with MyChart, go to [email or web address].    Your next appointment:   6 month(s)  The format for your next appointment:   In Person  Provider:   Jodelle Red, MD   Other Instructions In terms of your heart history: -Dr. Donnie Aho noted a history of cardiomyopathy (abnormal heart squeeze). I do not have all your prior records, but the most recent note from Dr. Donnie Aho notes an EF of 50%. Normal is 55%-60%, so this is not that abnormal.  -you have a history of high blood pressure. This is well controlled  -you have a history of aortic calcium (atherosclerosis). This is a marker for plaque. This is what the atorvastatin is for.Your heart cath in 2016 did not show any blockages in the vessels that feed the heart.  -you have a history of slow heart rates, but this is better since stopping carvedilol  For blood  pressure, top number should be more than 100 but less than 140 ideally.

## 2020-11-19 NOTE — Telephone Encounter (Signed)
Pt has been scheduled to see Dr. Cristal Deer today, 11/19/20 at 1:40.  Surgery clearance / new symptoms will be addressed at that time.  Will route back to the requesting surgeon's office to make them aware.

## 2021-02-05 ENCOUNTER — Other Ambulatory Visit: Payer: Self-pay | Admitting: Family Medicine

## 2021-02-05 DIAGNOSIS — Z1231 Encounter for screening mammogram for malignant neoplasm of breast: Secondary | ICD-10-CM

## 2021-02-19 ENCOUNTER — Telehealth: Payer: Self-pay | Admitting: *Deleted

## 2021-02-19 NOTE — Telephone Encounter (Signed)
   [facility] Pre-operative Risk Assessment    HEARTCARE STAFF: - Please ensure there is not already an duplicate clearance open for this procedure. - Under Visit Info/Reason for Call, type in Other and utilize the format Clearance MM/DD/YY or Clearance TBD. Do not use dashes or single digits. - If request is for dental extraction, please clarify the # of teeth to be extracted.  Request for surgical clearance:  1. What type of surgery is being performed? Left total knee replacement  2. When is this surgery scheduled? TBD   3. What type of clearance is required (medical clearance vs. Pharmacy clearance to hold med vs. Both)? medical  4. Are there any medications that need to be held prior to surgery and how long?none   5. Practice name and name of physician performing surgery? [facility]   6. What is the office phone number? [phone]   7.   What is the office fax number? [phone]  8.   Anesthesia type (None, local, MAC, general) ? Not listed   Cheryl Chase 02/19/2021, 1:26 PM  _________________________________________________________________   (provider comments below)

## 2021-02-20 NOTE — Telephone Encounter (Signed)
Called on 02/20/2021 at 8:24 AM.  Voicemail not set up patient needs call back.

## 2021-02-26 NOTE — Telephone Encounter (Signed)
   Primary Cardiologist: Jodelle Red, MD  Chart reviewed as part of pre-operative protocol coverage.   Attempted to contact patient for preop assessment. No voicemail option on home number and full voicemail box on mobile. Will need call back at a later date.   Beatriz Stallion, PA-C 02/26/2021, 10:59 AM

## 2021-03-03 NOTE — Telephone Encounter (Addendum)
   Primary Cardiologist: Jodelle Red, MD  Chart reviewed as part of pre-operative protocol coverage. Patient was contacted 03/03/2021 in reference to pre-operative risk assessment for pending surgery as outlined below.  Oswin Johal was last seen on 11/2020 by Dr. Cristal Deer. Cardiac history includes syncope, cardiomyopathy, aortic atherosclerosis, HTN, bradycardia with normal coronaries 2016, EF 40-45%. Last assessment 2019 EF 50%. Doing well at last OV. RCRI 0.9% indicating overall low CV risk for surgery. Reached out to patient to find out how she is doing. Home number states it has been disconnected. Mobile number just rings and mailbox is full. Will need to try again tomorrow.  Laurann Montana, PA-C 03/03/2021, 8:37 AM

## 2021-03-11 NOTE — Telephone Encounter (Addendum)
Called Cheryl Chase to obtain alternate contact number and they suggested calling her sister Cheryl Chase, listed as someone we can speak with in our system.   I called and left a VM on sister's phone.

## 2021-03-14 ENCOUNTER — Telehealth: Payer: Self-pay | Admitting: Cardiology

## 2021-03-14 NOTE — Telephone Encounter (Signed)
Follow up:    Patient returning a call back. The best number [phone].

## 2021-03-14 NOTE — Telephone Encounter (Signed)
Tried calling patient back on new/reported number.  Phone continue to ring.  Unable to leave message.  Patient needs call back.

## 2021-03-17 NOTE — Telephone Encounter (Signed)
Hi Dr. Cristal Deer,   Patient has upcoming knee surgery planned. She saw you back in 11/2020 for pre-op evaluation for this procedure but it sounds like the procedure got postponed due to COVID. I called back today just to make sure she had not had any new cardiac symptoms since her visit with you. She did report she continues to have left arm pain that sounds non-cardiac (occurs if she sleeps on it the wrong way). No arm pain with exertion. No chest pain, shortness of breath, or acute CHF symptoms. However, she did note one episode of syncope in February that occurred after she was trying to have a bowel movement and then stool up. She states she broke out into a cold sweat prior to this. Sounds like vasovagal syncope. She states she was only unconscious for a couple of seconds but states the family who found her said she had "no pulse."  Does not look like she had any recent cardiac evaluation. So I just wanted to check with you to see if you feel like she needed a Echo or anything prior to surgery?  Please route response back to P CV DIV PRE-OP pool. Thank you!  Of note, for anyone covering the pre-op pool. Best number to reach patient at is [phone].  Corrin Parker, PA-C 03/17/2021 10:18 AM

## 2021-03-20 NOTE — Telephone Encounter (Signed)
She has had syncope in the past, though the comment about no pulse is concerning. I would get an echo to rule out structural causes, and if that is unremarkable then should be acceptable risk for surgery.

## 2021-03-24 ENCOUNTER — Telehealth: Payer: Self-pay

## 2021-03-24 DIAGNOSIS — Z8679 Personal history of other diseases of the circulatory system: Secondary | ICD-10-CM

## 2021-03-24 DIAGNOSIS — Z01818 Encounter for other preprocedural examination: Secondary | ICD-10-CM

## 2021-03-24 NOTE — Telephone Encounter (Signed)
Spoke to patient advised Cadence Furth PA ordered a echo for pre op clearance.Scheduler will call back with echo appointment.

## 2021-03-24 NOTE — Telephone Encounter (Signed)
Please schedue patient for echo.

## 2021-03-24 NOTE — Telephone Encounter (Signed)
Message sent to schedule echo for pre-op evaluation

## 2021-03-27 ENCOUNTER — Other Ambulatory Visit: Payer: Self-pay

## 2021-03-27 ENCOUNTER — Ambulatory Visit
Admission: RE | Admit: 2021-03-27 | Discharge: 2021-03-27 | Disposition: A | Discharge status: Home / Self Care | Payer: Medicare Other | Source: Ambulatory Visit | Attending: Family Medicine | Admitting: Family Medicine

## 2021-03-27 ENCOUNTER — Ambulatory Visit: Payer: Medicare Other

## 2021-03-27 DIAGNOSIS — Z1231 Encounter for screening mammogram for malignant neoplasm of breast: Secondary | ICD-10-CM

## 2021-04-03 NOTE — Telephone Encounter (Signed)
Echocardiogram scheduled for 04/22/2021

## 2021-04-08 ENCOUNTER — Encounter (HOSPITAL_COMMUNITY): Payer: Medicare Other

## 2021-04-15 ENCOUNTER — Ambulatory Visit: Admit: 2021-04-15 | Payer: Medicare Other | Admitting: Orthopedic Surgery

## 2021-04-15 SURGERY — ARTHROPLASTY, KNEE, TOTAL
Anesthesia: Choice | Site: Knee | Laterality: Left

## 2021-04-22 ENCOUNTER — Other Ambulatory Visit: Payer: Self-pay

## 2021-04-22 ENCOUNTER — Ambulatory Visit (HOSPITAL_COMMUNITY): Payer: Medicare Other | Attending: Cardiology

## 2021-04-22 DIAGNOSIS — Z01810 Encounter for preprocedural cardiovascular examination: Secondary | ICD-10-CM

## 2021-04-22 DIAGNOSIS — E669 Obesity, unspecified: Secondary | ICD-10-CM | POA: Insufficient documentation

## 2021-04-22 DIAGNOSIS — I10 Essential (primary) hypertension: Secondary | ICD-10-CM | POA: Insufficient documentation

## 2021-04-22 DIAGNOSIS — E785 Hyperlipidemia, unspecified: Secondary | ICD-10-CM | POA: Insufficient documentation

## 2021-04-22 DIAGNOSIS — Z8679 Personal history of other diseases of the circulatory system: Secondary | ICD-10-CM | POA: Insufficient documentation

## 2021-04-22 DIAGNOSIS — Z01818 Encounter for other preprocedural examination: Secondary | ICD-10-CM | POA: Diagnosis present

## 2021-04-22 LAB — ECHOCARDIOGRAM COMPLETE
Area-P 1/2: 3.21 cm2
S' Lateral: 3.9 cm

## 2021-04-23 NOTE — Telephone Encounter (Signed)
   Name: Cheryl Chase  DOB: 1956-06-10  MRN: [id number]   Primary Cardiologist: Jodelle Red, MD  Chart reviewed as part of pre-operative protocol coverage.  Echocardiogram 04/22/21 was reassuring with EF 55-60, no RWMA, and no significant valvular abnormalities.   As such, [name] would be at acceptable risk for the planned procedure without further cardiovascular testing.   I will route this recommendation to the requesting party via Epic fax function and remove from pre-op pool.  Please call with questions.  Beatriz Stallion, PA-C 04/23/2021, 8:52 AM

## 2021-05-20 NOTE — Patient Instructions (Addendum)
DUE TO COVID-19 ONLY ONE VISITOR IS ALLOWED TO COME WITH YOU AND STAY IN THE WAITING ROOM ONLY DURING PRE OP AND PROCEDURE DAY OF SURGERY. THE 1 VISITOR  MAY VISIT WITH YOU AFTER SURGERY IN YOUR PRIVATE ROOM DURING VISITING HOURS ONLY!  YOU NEED TO HAVE A COVID 19 TEST ON: 05/30/21 @ 11:00 AM , THIS TEST MUST BE DONE BEFORE SURGERY,  COVID TESTING SITE [address] [place] [address], IT IS ON THE RIGHT GOING OUT [address] APPROXIMATELY  2 MINUTES PAST [employer] ON THE RIGHT. ONCE YOUR COVID TEST IS COMPLETED,  PLEASE BEGIN THE QUARANTINE INSTRUCTIONS AS OUTLINED IN YOUR HANDOUT.                [employer]    Your procedure is scheduled on: 06/03/21   Report to [facility] Main  Entrance   Report to admitting at: 8:15 AM     Call this number if you have problems the morning of surgery [phone]    Remember:  NO SOLID FOOD AFTER MIDNIGHT THE NIGHT PRIOR TO SURGERY. NOTHING BY MOUTH EXCEPT CLEAR LIQUIDS UNTIL: 7:45 AM . PLEASE FINISH ENSURE DRINK PER SURGEON ORDER  WHICH NEEDS TO BE COMPLETED AT: 7:45 AM .  CLEAR LIQUID DIET  Foods Allowed                                                                     Foods Excluded  Coffee and tea, regular and decaf                             liquids that you cannot  Plain Jell-O any favor except red or purple                                           see through such as: Fruit ices (not with fruit pulp)                                     milk, soups, orange juice  Iced Popsicles                                    All solid food Carbonated beverages, regular and diet                                    Cranberry, grape and apple juices Sports drinks like Gatorade Lightly seasoned clear broth or consume(fat free) Sugar, honey syrup  Sample Menu Breakfast                                Lunch  Supper Cranberry juice                    Beef broth                             Chicken broth Jell-O                                     Grape juice                           Apple juice Coffee or tea                        Jell-O                                      Popsicle                                                Coffee or tea                        Coffee or tea  _____________________________________________________________________   BRUSH YOUR TEETH MORNING OF SURGERY AND RINSE YOUR MOUTH OUT, NO CHEWING GUM CANDY OR MINTS.    Take these medicines the morning of surgery with A SIP OF WATER: amlodipine.                               You may not have any metal on your body including hair pins and              piercings  Do not wear jewelry, make-up, lotions, powders or perfumes, deodorant             Do not wear nail polish on your fingernails.  Do not shave  48 hours prior to surgery.    Do not bring valuables to the hospital. [facility] IS NOT             RESPONSIBLE   FOR VALUABLES.  Contacts, dentures or bridgework may not be worn into surgery.  Leave suitcase in the car. After surgery it may be brought to your room.     Patients discharged the day of surgery will not be allowed to drive home. IF YOU ARE HAVING SURGERY AND GOING HOME THE SAME DAY, YOU MUST HAVE AN ADULT TO DRIVE YOU HOME AND BE WITH YOU FOR 24 HOURS. YOU MAY GO HOME BY TAXI OR UBER OR ORTHERWISE, BUT AN ADULT MUST ACCOMPANY YOU HOME AND STAY WITH YOU FOR 24 HOURS.  Name and phone number of your driver:  Special Instructions: N/A              Please read over the following fact sheets you were given: _____________________________________________________________________         [facility] - Preparing for Surgery Before surgery, you can play an important role.  Because skin is not sterile, your skin needs to be as free of germs as possible.  You can  reduce the number of germs on your skin by washing with CHG (chlorahexidine gluconate) soap before surgery.  CHG is an antiseptic  cleaner which kills germs and bonds with the skin to continue killing germs even after washing. Please DO NOT use if you have an allergy to CHG or antibacterial soaps.  If your skin becomes reddened/irritated stop using the CHG and inform your nurse when you arrive at Short Stay. Do not shave (including legs and underarms) for at least 48 hours prior to the first CHG shower.  You may shave your face/neck. Please follow these instructions carefully:  1.  Shower with CHG Soap the night before surgery and the  morning of Surgery.  2.  If you choose to wash your hair, wash your hair first as usual with your  normal  shampoo.  3.  After you shampoo, rinse your hair and body thoroughly to remove the  shampoo.                           4.  Use CHG as you would any other liquid soap.  You can apply chg directly  to the skin and wash                       Gently with a scrungie or clean washcloth.  5.  Apply the CHG Soap to your body ONLY FROM THE NECK DOWN.   Do not use on face/ open                           Wound or open sores. Avoid contact with eyes, ears mouth and genitals (private parts).                       Wash face,  Genitals (private parts) with your normal soap.             6.  Wash thoroughly, paying special attention to the area where your surgery  will be performed.  7.  Thoroughly rinse your body with warm water from the neck down.  8.  DO NOT shower/wash with your normal soap after using and rinsing off  the CHG Soap.                9.  Pat yourself dry with a clean towel.            10.  Wear clean pajamas.            11.  Place clean sheets on your bed the night of your first shower and do not  sleep with pets. Day of Surgery : Do not apply any lotions/deodorants the morning of surgery.  Please wear clean clothes to the hospital/surgery center.  FAILURE TO FOLLOW THESE INSTRUCTIONS MAY RESULT IN THE CANCELLATION OF YOUR SURGERY PATIENT  SIGNATURE_________________________________  NURSE SIGNATURE__________________________________  ________________________________________________________________________   Adam Phenix  An incentive spirometer is a tool that can help keep your lungs clear and active. This tool measures how well you are filling your lungs with each breath. Taking long deep breaths may help reverse or decrease the chance of developing breathing (pulmonary) problems (especially infection) following:  A long period of time when you are unable to move or be active. BEFORE THE PROCEDURE   If the spirometer includes an indicator to show your best effort, your nurse or respiratory therapist will set it  to a desired goal.  If possible, sit up straight or lean slightly forward. Try not to slouch.  Hold the incentive spirometer in an upright position. INSTRUCTIONS FOR USE  1. Sit on the edge of your bed if possible, or sit up as far as you can in bed or on a chair. 2. Hold the incentive spirometer in an upright position. 3. Breathe out normally. 4. Place the mouthpiece in your mouth and seal your lips tightly around it. 5. Breathe in slowly and as deeply as possible, raising the piston or the ball toward the top of the column. 6. Hold your breath for 3-5 seconds or for as long as possible. Allow the piston or ball to fall to the bottom of the column. 7. Remove the mouthpiece from your mouth and breathe out normally. 8. Rest for a few seconds and repeat Steps 1 through 7 at least 10 times every 1-2 hours when you are awake. Take your time and take a few normal breaths between deep breaths. 9. The spirometer may include an indicator to show your best effort. Use the indicator as a goal to work toward during each repetition. 10. After each set of 10 deep breaths, practice coughing to be sure your lungs are clear. If you have an incision (the cut made at the time of surgery), support your incision when coughing  by placing a pillow or rolled up towels firmly against it. Once you are able to get out of bed, walk around indoors and cough well. You may stop using the incentive spirometer when instructed by your caregiver.  RISKS AND COMPLICATIONS  Take your time so you do not get dizzy or light-headed.  If you are in pain, you may need to take or ask for pain medication before doing incentive spirometry. It is harder to take a deep breath if you are having pain. AFTER USE  Rest and breathe slowly and easily.  It can be helpful to keep track of a log of your progress. Your caregiver can provide you with a simple table to help with this. If you are using the spirometer at home, follow these instructions: [place] IF:   You are having difficultly using the spirometer.  You have trouble using the spirometer as often as instructed.  Your pain medication is not giving enough relief while using the spirometer.  You develop fever of 100.5 F (38.1 C) or higher. SEEK IMMEDIATE MEDICAL CARE IF:   You cough up bloody sputum that had not been present before.  You develop fever of 102 F (38.9 C) or greater.  You develop worsening pain at or near the incision site. MAKE SURE YOU:   Understand these instructions.  Will watch your condition.  Will get help right away if you are not doing well or get worse. Document Released: 04/12/2007 Document Revised: 02/22/2012 Document Reviewed: 06/13/2007 [facility] Patient Information 2014 [place], [place].   ________________________________________________________________________

## 2021-05-20 NOTE — Progress Notes (Signed)
Cardiology Office Note:    Date:  06/12/2021   ID:  Cheryl Chase Sep 25, 1956, MRN [id number]  PCP:  Cheryl Mask, MD  Cardiologist:  Jodelle Red, MD PhD (prior Dr. Donnie Aho)  Referring MD: Cheryl Chase, *   CC: follow up  History of Present Illness:    Cheryl Chase is a 65 y.o. female with a hx of syncope, history of HF/cardiomyopathy, hyperlipidemia, atherosclerosis per notes with recovered EF, severe arthritis. She is a prior patient of Dr. York Chase.  Today: Her operation is now scheduled for 06/03/21. If she lifts her left arm too high it becomes painful. She notes some minor LE edema around her knees.  Typically she takes amlodipine at night. She is not taking any medication twice a day.  She denies any chest pain, shortness of breath, palpitations, or exertional symptoms. No headaches, lightheadedness, or syncope to report. Also has no lower extremity edema, orthopnea or PND.   Past Medical History:  Diagnosis Date   Arthritis    Atherosclerosis 12/24/2014   Essential hypertension 12/24/2014   GERD (gastroesophageal reflux disease) 12/24/2014   Hyperlipidemia 12/24/2014   Hypertension    Lumbar disc disease 12/24/2014   Overweight 12/24/2014   Syncope     Past Surgical History:  Procedure Laterality Date   ABDOMINAL HYSTERECTOMY     LEFT HEART CATHETERIZATION WITH CORONARY ANGIOGRAM N/A 12/27/2014   Procedure: LEFT HEART CATHETERIZATION WITH CORONARY ANGIOGRAM;  Surgeon: Cheryl Boyer, MD;  Location: [facility] CATH LAB;  Service: Cardiovascular;  Laterality: N/A;   TOTAL KNEE ARTHROPLASTY Left 06/03/2021   Procedure: TOTAL KNEE ARTHROPLASTY;  Surgeon: Cheryl Apley, MD;  Location: [facility] ORS;  Service: Orthopedics;  Laterality: Left;    Current Medications: Current Outpatient Medications on File Prior to Visit  Medication Sig   atorvastatin (LIPITOR) 10 MG tablet Take 1 tablet (10 mg total) by mouth daily.   docusate sodium (COLACE)  100 MG capsule Take 100 mg by mouth daily.   Melatonin 10 MG TABS Take 10 mg by mouth at bedtime.   rOPINIRole (REQUIP) 2 MG tablet Take 2 mg by mouth at bedtime. ONCE DAILY 1 TO 3 HOURS BEFORE BEDTIME FOR RESTLESSNESS   spironolactone (ALDACTONE) 25 MG tablet Take 0.5 tablets (12.5 mg total) by mouth daily.   triamcinolone ointment (KENALOG) 0.1 % Apply 1 application topically daily as needed (rash).   acetaminophen (TYLENOL) 500 MG tablet Take 2 tablets (1,000 mg total) by mouth every 8 (eight) hours for 14 days.   amLODipine (NORVASC) 2.5 MG tablet Take 1 tablet (2.5 mg total) by mouth daily.   aspirin (ASPIRIN CHILDRENS) 81 MG chewable tablet Chew 1 tablet (81 mg total) by mouth 2 (two) times daily. For DVT prophylaxis after surgery   gabapentin (NEURONTIN) 100 MG capsule Take 1 capsule (100 mg total) by mouth 3 (three) times daily. For pain   meloxicam (MOBIC) 15 MG tablet Take 1 tablet (15 mg total) by mouth daily. For pain / inflammation   methocarbamol (ROBAXIN) 500 MG tablet Take 1 tablet (500 mg total) by mouth every 8 (eight) hours as needed for muscle spasms.   omeprazole (PRILOSEC) 20 MG capsule Take 1 capsule (20 mg total) by mouth daily. To gastric protection while taking NSAIDs   ondansetron (ZOFRAN) 4 MG tablet Take 1 tablet (4 mg total) by mouth every 8 (eight) hours as needed for up to 7 days for nausea or vomiting.   No current facility-administered medications on file prior  to visit.     Allergies:   Patient has no known allergies.   Social History   Tobacco Use   Smoking status: Never   Smokeless tobacco: Current    Types: Snuff  Vaping Use   Vaping Use: Never used  Substance Use Topics   Alcohol use: Not Currently   Drug use: No    Family History: Sisters: atherosclerosis, pacemaker, diabetes, hypertension  ROS:   Please see the history of present illness.   (+) Left arm pain (+) Bilateral LE edema, knees Additional pertinent ROS otherwise  unremarkable  EKGs/Labs/Other Studies Reviewed:    The following studies were reviewed today:  Echo 04/22/2021:  1. Left ventricular ejection fraction, by estimation, is 55 to 60%. Left  ventricular ejection fraction by 3D volume is 56 %. The left ventricle has  normal function. The left ventricle has no regional wall motion  abnormalities. The left ventricular  internal cavity size was mildly dilated. Left ventricular diastolic  parameters are indeterminate.   2. Right ventricular systolic function is normal. The right ventricular  size is normal. There is normal pulmonary artery systolic pressure. The  estimated right ventricular systolic pressure is 19.6 mmHg.   3. The mitral valve is normal in structure. No evidence of mitral valve  regurgitation.   4. The aortic valve is tricuspid. Aortic valve regurgitation is not  visualized. No aortic stenosis is present.   5. The inferior vena cava is normal in size with greater than 50%  respiratory variability, suggesting right atrial pressure of 3 mmHg.  Notes from Dr. Donnie Aho: Cath 12/27/14: no significant CAD, EF 40-45%. Mild aneurysmal dilation of the pRCA. Nuclear study 12/20/14: EF 44%, possible PL ischemia Echo 02/28/18: mild cLVH, no RWMA, EF 50%. No significant valve disease Event monitor  EKG:  ECG personally reviewed today.  05/21/2021: EKG is not ordered today. 06/20/20: sinus bradycardia at 44 bpm with nonspecific ST changes  Recent Labs: 06/04/2021: BUN 16; Creatinine, Ser 0.80; Potassium 4.6; Sodium 139 06/05/2021: Hemoglobin 11.4; Platelets 212  Recent Lipid Panel    Component Value Date/Time   CHOL 128 03/11/2011 2027   TRIG 85 03/11/2011 2027   HDL 42 03/11/2011 2027   CHOLHDL 3.0 Ratio 03/11/2011 2027   VLDL 17 03/11/2011 2027   LDLCALC 69 03/11/2011 2027    Physical Exam:    VS:  BP 130/74   Pulse (!) 51   Ht 5\' 5"  (1.651 m)   Wt 176 lb 12.8 oz (80.2 kg)   SpO2 94%   BMI 29.42 kg/m     Wt Readings from Last  3 Encounters:  06/03/21 175 lb (79.4 kg)  05/21/21 175 lb (79.4 kg)  05/21/21 176 lb 12.8 oz (80.2 kg)    GEN: Well nourished, well developed in no acute distress HEENT: Normal, moist mucous membranes NECK: No JVD CARDIAC: regular rhythm, normal S1 and S2, no rubs or gallops. No murmur. VASCULAR: Radial and DP pulses 2+ bilaterally. No carotid bruits RESPIRATORY:  Clear to auscultation without rales, wheezing or rhonchi  ABDOMEN: Soft, non-tender, non-distended MUSCULOSKELETAL:  Ambulates independently with assistive devices SKIN: Warm and dry, no edema NEUROLOGIC:  Alert and oriented x 3. No focal neuro deficits noted. PSYCHIATRIC:  Normal affect   ASSESSMENT:    1. History of syncope   2. Bradycardia, sinus   3. Essential hypertension   4. History of cardiomyopathy   5. Aortic atherosclerosis (HCC)    PLAN:    Sinus bradycardia History of syncope -  heart rate now 51 on no nodal agents -no further syncope -follow home heart rate/blood pressure numbers   Reported history of cardiomyopathy: asymptomatic, most recent EF 50% per Dr. York Chase notes, no CAD on cath -continue spironolactone   Hypertension: at goal today -no beta blockers due to bradycardia -continue amlodipine 2.5 mg daily -continue spironolactone   Aortic atherosclerosis on chest x-ray 2015 Hyperlipidemia -continue atorvastatin 10 mg daily -check lipids at follow up if not done sooner  Cardiac risk counseling and prevention recommendations: -recommend heart healthy/Mediterranean diet, with whole grains, fruits, vegetable, fish, lean meats, nuts, and olive oil. Limit salt. -recommend moderate walking, 3-5 times/week for 30-50 minutes each session. Aim for at least 150 minutes.week. Goal should be pace of 3 miles/hours, or walking 1.5 miles in 30 minutes -recommend avoidance of tobacco products. Avoid excess alcohol.  Plan for follow up: 1 year or sooner PRN.  Medication Adjustments/Labs and Tests  Ordered: Current medicines are reviewed at length with the patient today.  Concerns regarding medicines are outlined above.  No orders of the defined types were placed in this encounter.  No orders of the defined types were placed in this encounter.   Patient Instructions  Medication Instructions:  Your Physician recommend you continue on your current medication as directed.    *If you need a refill on your cardiac medications before your next appointment, please call your pharmacy*   Lab Work: None ordered   Testing/Procedures: None ordered   Follow-Up: At [facility], you and your health needs are our priority.  As part of our continuing mission to provide you with exceptional heart care, we have created designated Provider Care Teams.  These Care Teams include your primary Cardiologist (physician) and Advanced Practice Providers (APPs -  Physician Assistants and Nurse Practitioners) who all work together to provide you with the care you need, when you need it.  We recommend signing up for the patient portal called "MyChart".  Sign up information is provided on this After Visit Summary.  MyChart is used to connect with patients for Virtual Visits (Telemedicine).  Patients are able to view lab/test results, encounter notes, upcoming appointments, etc.  Non-urgent messages can be sent to your provider as well.   To learn more about what you can do with MyChart, go to [email or web address].    Your next appointment:   1 year(s) @ [address] [place], [place] [address]   The format for your next appointment:   In Person  Provider:   Jodelle Red, MD      [facility],acting as a scribe for Jodelle Red, MD.,have documented all relevant documentation on the behalf of Jodelle Red, MD,as directed by  Jodelle Red, MD while in the presence of Jodelle Red, MD.  I, Jodelle Red, MD, have reviewed all  documentation for this visit. The documentation on 06/12/21 for the exam, diagnosis, procedures, and orders are all accurate and complete.   Signed, Jodelle Red, MD PhD 06/12/2021    [facility]

## 2021-05-21 ENCOUNTER — Other Ambulatory Visit: Payer: Self-pay

## 2021-05-21 ENCOUNTER — Encounter (HOSPITAL_COMMUNITY): Payer: Self-pay

## 2021-05-21 ENCOUNTER — Encounter: Payer: Self-pay | Admitting: Cardiology

## 2021-05-21 ENCOUNTER — Ambulatory Visit (INDEPENDENT_AMBULATORY_CARE_PROVIDER_SITE_OTHER): Payer: Medicare Other | Admitting: Cardiology

## 2021-05-21 ENCOUNTER — Encounter (HOSPITAL_COMMUNITY)
Admission: RE | Admit: 2021-05-21 | Discharge: 2021-05-21 | Disposition: A | Discharge status: Home / Self Care | Payer: Medicare Other | Source: Ambulatory Visit | Attending: Orthopedic Surgery | Admitting: Orthopedic Surgery

## 2021-05-21 VITALS — BP 130/74 | HR 51 | Ht 65.0 in | Wt 176.8 lb

## 2021-05-21 DIAGNOSIS — I10 Essential (primary) hypertension: Secondary | ICD-10-CM | POA: Diagnosis not present

## 2021-05-21 DIAGNOSIS — Z8679 Personal history of other diseases of the circulatory system: Secondary | ICD-10-CM

## 2021-05-21 DIAGNOSIS — R001 Bradycardia, unspecified: Secondary | ICD-10-CM

## 2021-05-21 DIAGNOSIS — Z87898 Personal history of other specified conditions: Secondary | ICD-10-CM | POA: Diagnosis not present

## 2021-05-21 DIAGNOSIS — Z01812 Encounter for preprocedural laboratory examination: Secondary | ICD-10-CM | POA: Diagnosis present

## 2021-05-21 DIAGNOSIS — I700 Atherosclerosis of aorta: Secondary | ICD-10-CM

## 2021-05-21 HISTORY — DX: Syncope and collapse: R55

## 2021-05-21 LAB — BASIC METABOLIC PANEL
Anion gap: 5 (ref 5–15)
BUN: 16 mg/dL (ref 8–23)
CO2: 28 mmol/L (ref 22–32)
Calcium: 9.3 mg/dL (ref 8.9–10.3)
Chloride: 109 mmol/L (ref 98–111)
Creatinine, Ser: 0.97 mg/dL (ref 0.44–1.00)
GFR, Estimated: >60 mL/min (ref >60)
Glucose, Bld: 130 mg/dL — ABNORMAL HIGH (ref 70–99)
Potassium: 3.4 mmol/L — ABNORMAL LOW (ref 3.5–5.1)
Sodium: 142 mmol/L (ref 135–145)

## 2021-05-21 LAB — CBC
HCT: 37.1 % (ref 36.0–46.0)
Hemoglobin: 12.2 g/dL (ref 12.0–15.0)
MCH: 28 pg (ref 26.0–34.0)
MCHC: 32.9 g/dL (ref 30.0–36.0)
MCV: 85.3 fL (ref 80.0–100.0)
Platelets: 186 10*3/uL (ref 150–400)
RBC: 4.35 MIL/uL (ref 3.87–5.11)
RDW: 12.8 % (ref 11.5–15.5)
WBC: 7.5 10*3/uL (ref 4.0–10.5)
nRBC: 0 % (ref 0.0–0.2)

## 2021-05-21 LAB — SURGICAL PCR SCREEN
MRSA, PCR: NEGATIVE
Staphylococcus aureus: NEGATIVE

## 2021-05-21 NOTE — Patient Instructions (Signed)
Medication Instructions:  Your Physician recommend you continue on your current medication as directed.    *If you need a refill on your cardiac medications before your next appointment, please call your pharmacy*   Lab Work: None ordered   Testing/Procedures: None ordered   Follow-Up: At [facility], you and your health needs are our priority.  As part of our continuing mission to provide you with exceptional heart care, we have created designated Provider Care Teams.  These Care Teams include your primary Cardiologist (physician) and Advanced Practice Providers (APPs -  Physician Assistants and Nurse Practitioners) who all work together to provide you with the care you need, when you need it.  We recommend signing up for the patient portal called "MyChart".  Sign up information is provided on this After Visit Summary.  MyChart is used to connect with patients for Virtual Visits (Telemedicine).  Patients are able to view lab/test results, encounter notes, upcoming appointments, etc.  Non-urgent messages can be sent to your provider as well.   To learn more about what you can do with MyChart, go to [email or web address].    Your next appointment:   1 year(s) @ [address] [place], [place] [address]   The format for your next appointment:   In Person  Provider:   Jodelle Red, MD

## 2021-05-21 NOTE — Progress Notes (Signed)
COVID Vaccine Completed: Yes Date COVID Vaccine completed: 09/18/20 COVID vaccine manufacturer: Pfizer     PCP - Dr. Windle Guard Cardiologist - Dr. Jodelle Red. LOV: 05/21/21: Clearance: 04/23/20: EPIC  Chest x-ray -  EKG - 11/18/20 Stress Test -  ECHO - 04/22/21 Cardiac Cath -  Pacemaker/ICD device last checked:  Sleep Study -  CPAP -   Fasting Blood Sugar -  Checks Blood Sugar _____ times a day  Blood Thinner Instructions: Aspirin Instructions: Last Dose:  Anesthesia review: HTN,syncopy.  Patient denies shortness of breath, fever, cough and chest pain at PAT appointment   Patient verbalized understanding of instructions that were given to them at the PAT appointment. Patient was also instructed that they will need to review over the PAT instructions again at home before surgery.

## 2021-05-22 NOTE — Progress Notes (Signed)
Anesthesia Chart Review:   Case: [id number] Date/Time: 06/03/21 1030   Procedure: TOTAL KNEE ARTHROPLASTY (Left: Knee)   Anesthesia type: Choice   Pre-op diagnosis: OA LEFT KNEE   Location: [facility] ROOM 08 / [facility]   Surgeons: Sheral Apley, MD       DISCUSSION: Pt is a 65 year old with hx heart failure, HTN   VS: BP 127/72   Pulse (!) 59   Temp 37.2 C (Oral)   Ht 5\' 5"  (1.651 m)   Wt 79.4 kg   SpO2 97%   BMI 29.12 kg/m   PROVIDERS: - PCP is , MD - Cardiologist is Kaleen Mask, MD. Last office visit 11/19/20. Cleared for surgery at acceptable risk on 04/23/21 by 06/23/21, PA   LABS: Labs reviewed: Acceptable for surgery. (all labs ordered are listed, but only abnormal results are displayed)  Labs Reviewed  BASIC METABOLIC PANEL - Abnormal; Notable for the following components:      Result Value   Potassium 3.4 (*)    Glucose, Bld 130 (*)    All other components within normal limits  SURGICAL PCR SCREEN  CBC    EKG 11/19/20: sinus bradycardia with occasional PVCs   CV: Echo 04/22/21:  1. Left ventricular ejection fraction, by estimation, is 55 to 60%. Left ventricular ejection fraction by 3D volume is 56 %. The left ventricle has normal function. The left ventricle has no regional wall motion abnormalities. The left ventricular internal cavity size was mildly dilated. Left ventricular diastolic parameters are indeterminate.  2. Right ventricular systolic function is normal. The right ventricular size is normal. There is normal pulmonary artery systolic pressure. The estimated right ventricular systolic pressure is 19.6 mmHg.  3. The mitral valve is normal in structure. No evidence of mitral valve regurgitation.  4. The aortic valve is tricuspid. Aortic valve regurgitation is not visualized. No aortic stenosis is present.  5. The inferior vena cava is normal in size with greater than 50% respiratory variability, suggesting right atrial  pressure of 3 mmHg.   Cath 12/27/2014 ANGIOGRAPHIC DATA:  - CORONARY ARTERIES:   Arise and distribute normally.  Right dominant. No coronary calcification is noted. - Left main coronary artery: Normal. - Left anterior descending: Normal. - Circumflex coronary artery: Normal.  - Right coronary artery: There is aneurysmal dilation of the proximal portion of the artery with mild irregularity.  Prior to that aneurysmal dilation, but no significant obstructive stenosis is noted. IMPRESSIONS:  No significant obstructive coronary artery disease.  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 Abnormal ventricular function with global hypokinesis and hypertrophy    Past Medical History:  Diagnosis Date   Arthritis    Atherosclerosis 12/24/2014   Essential hypertension 12/24/2014   GERD (gastroesophageal reflux disease) 12/24/2014   Hyperlipidemia 12/24/2014   Hypertension    Lumbar disc disease 12/24/2014   Overweight 12/24/2014   Syncope     Past Surgical History:  Procedure Laterality Date   ABDOMINAL HYSTERECTOMY     LEFT HEART CATHETERIZATION WITH CORONARY ANGIOGRAM N/A 12/27/2014   Procedure: LEFT HEART CATHETERIZATION WITH CORONARY ANGIOGRAM;  Surgeon: 12/29/2014, MD;  Location: [facility] CATH LAB;  Service: Cardiovascular;  Laterality: N/A;    MEDICATIONS:  amLODipine (NORVASC) 2.5 MG tablet   atorvastatin (LIPITOR) 10 MG tablet   docusate sodium (COLACE) 100 MG capsule   Melatonin 10 MG TABS   rOPINIRole (REQUIP) 2 MG tablet   spironolactone (ALDACTONE) 25  MG tablet   triamcinolone ointment (KENALOG) 0.1 %   No current facility-administered medications for this encounter.    If no changes, I anticipate pt can proceed with surgery as scheduled.   Rica Mast, PhD, FNP-BC [facility]/Anesthesiology Phone: [phone] 05/22/2021 4:21 PM

## 2021-05-22 NOTE — Anesthesia Preprocedure Evaluation (Addendum)
Anesthesia Evaluation  Patient identified by MRN, date of birth, ID band Patient awake    Reviewed: Allergy & Precautions, NPO status , Patient's Chart, lab work & pertinent test results  History of Anesthesia Complications Negative for: history of anesthetic complications  Airway Mallampati: II  TM Distance: >3 FB Neck ROM: Full    Dental  (+) Edentulous Lower, Edentulous Upper   Pulmonary neg pulmonary ROS,    Pulmonary exam normal        Cardiovascular hypertension, Pt. on medications Normal cardiovascular exam   '22 TTE - EF 55 to 60%. The left ventricular internal cavity size was mildly dilated. No significant valvulopathy    Neuro/Psych negative neurological ROS  negative psych ROS   GI/Hepatic Neg liver ROS, GERD  Controlled,  Endo/Other  negative endocrine ROS  Renal/GU negative Renal ROS     Musculoskeletal  (+) Arthritis ,   Abdominal   Peds  Hematology negative hematology ROS (+)   Anesthesia Other Findings Covid test negative   Reproductive/Obstetrics                           Anesthesia Physical Anesthesia Plan  ASA: 2  Anesthesia Plan: Spinal   Post-op Pain Management:  Regional for Post-op pain   Induction:   PONV Risk Score and Plan: 2 and Treatment may vary due to age or medical condition and Propofol infusion  Airway Management Planned: Natural Airway and Simple Face Mask  Additional Equipment: None  Intra-op Plan:   Post-operative Plan:   Informed Consent: I have reviewed the patients History and Physical, chart, labs and discussed the procedure including the risks, benefits and alternatives for the proposed anesthesia with the patient or authorized representative who has indicated his/her understanding and acceptance.       Plan Discussed with: CRNA and Anesthesiologist  Anesthesia Plan Comments: (Labs reviewed, platelets acceptable. Discussed  risks and benefits of spinal, including spinal/epidural hematoma, infection, failed block, and PDPH. Patient expressed understanding and wished to proceed. )      Anesthesia Quick Evaluation

## 2021-05-22 NOTE — H&P (Signed)
KNEE ARTHROPLASTY ADMISSION H&P  Patient ID: Cheryl Chase MRN: [id number] DOB/AGE: 67/13/1957 67 y.o.  Chief Complaint: bilaterally knee pain.  Planned Procedure Date: 06/03/21 Medical Clearance by Dr. Jeannetta Nap   Cardiac Clearance by Dr. Cristal Deer    HPI: Cheryl Chase is a 65 y.o. female who presents for evaluation of OA LEFT KNEE. The patient has a history of pain and functional disability in the bilaterally knee due to arthritis and has failed non-surgical conservative treatments for greater than 12 weeks to include NSAID's and/or analgesics, corticosteriod injections, viscosupplementation injections, use of assistive devices, and activity modification.  Onset of symptoms was gradual, starting 5 years ago with gradually worsening course since that time. The patient noted no past surgery on the left knee.  Patient currently rates pain at 8 out of 10 with activity. Patient has night pain, worsening of pain with activity and weight bearing, and pain that interferes with activities of daily living.  Patient has evidence of subchondral sclerosis, periarticular osteophytes, and joint space narrowing by imaging studies.  There is no active infection.  Past Medical History:  Diagnosis Date   Arthritis    Atherosclerosis 12/24/2014   Essential hypertension 12/24/2014   GERD (gastroesophageal reflux disease) 12/24/2014   Hyperlipidemia 12/24/2014   Hypertension    Lumbar disc disease 12/24/2014   Overweight 12/24/2014   Syncope    Past Surgical History:  Procedure Laterality Date   ABDOMINAL HYSTERECTOMY     LEFT HEART CATHETERIZATION WITH CORONARY ANGIOGRAM N/A 12/27/2014   Procedure: LEFT HEART CATHETERIZATION WITH CORONARY ANGIOGRAM;  Surgeon: Othella Boyer, MD;  Location: [facility] CATH LAB;  Service: Cardiovascular;  Laterality: N/A;   No Known Allergies Prior to Admission medications   Medication Sig Start Date End Date Taking? Authorizing Provider  amLODipine (NORVASC) 2.5 MG tablet  Take 1 tablet (2.5 mg total) by mouth daily. 07/26/20 10/24/20 Yes Jodelle Red, MD  atorvastatin (LIPITOR) 10 MG tablet Take 1 tablet (10 mg total) by mouth daily. 07/26/20  Yes Jodelle Red, MD  docusate sodium (COLACE) 100 MG capsule Take 100 mg by mouth daily.   Yes [provider]  Melatonin 10 MG TABS Take 10 mg by mouth at bedtime.   Yes [provider]  rOPINIRole (REQUIP) 2 MG tablet Take 2 mg by mouth 3 (three) times daily as needed (restless leg).   Yes [provider]  spironolactone (ALDACTONE) 25 MG tablet Take 0.5 tablets (12.5 mg total) by mouth daily. 07/26/20  Yes Jodelle Red, MD  triamcinolone ointment (KENALOG) 0.1 % Apply 1 application topically daily as needed (rash). 05/28/20  Yes [provider]   Social History   Socioeconomic History   Marital status: Divorced    Spouse name: Not on file   Number of children: Not on file   Years of education: Not on file   Highest education level: Not on file  Occupational History   Not on file  Tobacco Use   Smoking status: Never   Smokeless tobacco: Current    Types: Snuff  Vaping Use   Vaping Use: Never used  Substance and Sexual Activity   Alcohol use: Not Currently   Drug use: No   Sexual activity: Not on file  Other Topics Concern   Not on file  Social History Narrative   Not on file   Social Determinants of Health   Financial Resource Strain: Not on file  Food Insecurity: Not on file  Transportation Needs: Not on file  Physical  Activity: Not on file  Stress: Not on file  Social Connections: Not on file   No family history on file.  ROS: Currently denies lightheadedness, dizziness, Fever, chills, CP, SOB.   No personal history of DVT, PE, MI, or CVA. H/o heart cath in 2016 but no stents placed. Has full dentures All other systems have been reviewed and were otherwise currently negative with the exception of those mentioned in the HPI and as  above.  Objective: Vitals: Ht: 5'3" Wt: 176 lbs Temp: 97.9 BP: 135/87 Pulse: 56 O2 97% on room air.   Physical Exam: General: Alert, NAD.  Antalgic Gait. B/L knee braces HEENT: EOMI, Good Neck Extension. Head is normocephalic, atraumatic. No pharyngeal erythema Pulm: No increased work of breathing.  Clear B/L A/P w/o crackle or wheeze.  CV: RRR, No m/g/r appreciated  GI: soft, NT, ND. BS x 4 quadrants Neuro: CN II-XII grossly intact without focal deficit.  Sensation intact distally Skin: No lesions in the area of chief complaint MSK/Surgical Site: left knee w/o redness or effusion. + JLT. ROM 20-100. Decreased strength in extension and flexion.  +EHL/FHL.  NVI.  + varus and valgus stress tests.    Imaging Review Plain radiographs demonstrate severe degenerative joint disease of the bilateral knees with the left being worse than the right.   The overall alignment issignificant varus. The bone quality appears to be fair for age and reported activity level.  Preoperative templating of the joint replacement has been completed, documented, and submitted to the Operating Room personnel in order to optimize intra-operative equipment management.  Assessment: OA LEFT KNEE Active Problems:   * No active hospital problems. *   Plan: Plan for Procedure(s): TOTAL KNEE ARTHROPLASTY  The patient history, physical exam, clinical judgement of the provider and imaging are consistent with end stage degenerative joint disease and total joint arthroplasty is deemed medically necessary. The treatment options including medical management, injection therapy, and arthroplasty were discussed at length. The risks and benefits of Procedure(s): TOTAL KNEE ARTHROPLASTY were presented and reviewed.  The risks of nonoperative treatment, versus surgical intervention including but not limited to continued pain, aseptic loosening, stiffness, dislocation/subluxation, infection, bleeding, nerve injury, blood clots,  cardiopulmonary complications, morbidity, mortality, among others were discussed. The patient verbalizes understanding and wishes to proceed with the plan.  Patient is being admitted for inpatient treatment for surgery, pain control, PT, prophylactic antibiotics, VTE prophylaxis, progressive ambulation, ADL's and discharge planning.   Dental prophylaxis discussed and recommended for 2 years postoperatively.  The patient does meet the criteria for TXA which will be used perioperatively.   ASA 81 mg BID will be used postoperatively for DVT prophylaxis in addition to SCDs, and early ambulation. Plan for Oxycodone, Tylenol, Mobic for pain.   Robaxin for muscle spasms.   Zofran for nausea and vomiting. Pharmacy- [facility] on [address] The patient is planning to be discharged home with OPPT and into the care of her sister Talbert Forest who can be reached at [phone] Follow up appt 06/18/21 4:15pm    Marzetta Board Office [phone] 05/22/2021 6:19 PM

## 2021-05-30 ENCOUNTER — Other Ambulatory Visit (HOSPITAL_COMMUNITY)
Admission: RE | Admit: 2021-05-30 | Discharge: 2021-05-30 | Disposition: A | Discharge status: Home / Self Care | Payer: Medicare Other | Source: Ambulatory Visit | Attending: Orthopedic Surgery | Admitting: Orthopedic Surgery

## 2021-05-30 DIAGNOSIS — Z20822 Contact with and (suspected) exposure to covid-19: Secondary | ICD-10-CM | POA: Diagnosis not present

## 2021-05-30 DIAGNOSIS — Z01812 Encounter for preprocedural laboratory examination: Secondary | ICD-10-CM | POA: Insufficient documentation

## 2021-05-30 LAB — SARS CORONAVIRUS 2 (TAT 6-24 HRS): SARS Coronavirus 2: NEGATIVE

## 2021-06-02 MED ORDER — BUPIVACAINE LIPOSOME 1.3 % IJ SUSP
20.0000 mL | Freq: Once | INTRAMUSCULAR | Status: DC
  Filled 2021-06-02: qty 20

## 2021-06-03 ENCOUNTER — Observation Stay (HOSPITAL_COMMUNITY)
Admission: RE | Admit: 2021-06-03 | Discharge: 2021-06-05 | Disposition: A | Discharge status: Home / Self Care | Payer: Medicare Other | Attending: Orthopedic Surgery | Admitting: Orthopedic Surgery

## 2021-06-03 ENCOUNTER — Encounter (HOSPITAL_COMMUNITY)
Admission: RE | Disposition: A | Discharge status: Home / Self Care | Payer: Self-pay | Source: Home / Self Care | Attending: Orthopedic Surgery

## 2021-06-03 ENCOUNTER — Other Ambulatory Visit: Payer: Self-pay

## 2021-06-03 ENCOUNTER — Encounter (HOSPITAL_COMMUNITY): Payer: Self-pay | Admitting: Orthopedic Surgery

## 2021-06-03 ENCOUNTER — Ambulatory Visit (HOSPITAL_COMMUNITY): Payer: Medicare Other | Admitting: Certified Registered Nurse Anesthetist

## 2021-06-03 ENCOUNTER — Ambulatory Visit (HOSPITAL_COMMUNITY): Payer: Medicare Other | Admitting: Emergency Medicine

## 2021-06-03 ENCOUNTER — Ambulatory Visit (HOSPITAL_COMMUNITY): Payer: Medicare Other

## 2021-06-03 DIAGNOSIS — M1712 Unilateral primary osteoarthritis, left knee: Secondary | ICD-10-CM | POA: Diagnosis present

## 2021-06-03 DIAGNOSIS — Z79899 Other long term (current) drug therapy: Secondary | ICD-10-CM | POA: Diagnosis not present

## 2021-06-03 DIAGNOSIS — Z09 Encounter for follow-up examination after completed treatment for conditions other than malignant neoplasm: Secondary | ICD-10-CM

## 2021-06-03 DIAGNOSIS — Z96652 Presence of left artificial knee joint: Secondary | ICD-10-CM

## 2021-06-03 DIAGNOSIS — I10 Essential (primary) hypertension: Secondary | ICD-10-CM | POA: Diagnosis not present

## 2021-06-03 HISTORY — PX: TOTAL KNEE ARTHROPLASTY: SHX125

## 2021-06-03 SURGERY — ARTHROPLASTY, KNEE, TOTAL
Anesthesia: Spinal | Site: Knee | Laterality: Left

## 2021-06-03 MED ORDER — PHENOL 1.4 % MT LIQD: 1.0000 | OROMUCOSAL | Status: DC | PRN

## 2021-06-03 MED ORDER — OXYCODONE HCL 5 MG PO TABS: 5.0000 mg | ORAL_TABLET | Freq: Once | ORAL | Status: DC | PRN

## 2021-06-03 MED ORDER — FENTANYL CITRATE (PF) 100 MCG/2ML IJ SOLN
INTRAMUSCULAR | Status: AC
  Administered 2021-06-03: 25 ug via INTRAVENOUS
  Filled 2021-06-03: qty 2

## 2021-06-03 MED ORDER — OXYCODONE HCL 5 MG PO TABS
5.0000 mg | ORAL_TABLET | ORAL | Status: DC | PRN
  Administered 2021-06-03: 5 mg via ORAL

## 2021-06-03 MED ORDER — CEFAZOLIN SODIUM-DEXTROSE 1-4 GM/50ML-% IV SOLN
1.0000 g | Freq: Four times a day (QID) | INTRAVENOUS | Status: AC
  Administered 2021-06-03 – 2021-06-04 (×2): 1 g via INTRAVENOUS
  Filled 2021-06-03 (×2): qty 50

## 2021-06-03 MED ORDER — ASPIRIN 81 MG PO CHEW: 81.0000 mg | CHEWABLE_TABLET | Freq: Two times a day (BID) | ORAL | 0 refills | Status: DC

## 2021-06-03 MED ORDER — DEXAMETHASONE SODIUM PHOSPHATE 10 MG/ML IJ SOLN
10.0000 mg | Freq: Once | INTRAMUSCULAR | Status: AC
  Administered 2021-06-04: 10 mg via INTRAVENOUS
  Filled 2021-06-03: qty 1

## 2021-06-03 MED ORDER — ROPINIROLE HCL 1 MG PO TABS: 2.0000 mg | ORAL_TABLET | Freq: Three times a day (TID) | ORAL | Status: DC | PRN

## 2021-06-03 MED ORDER — OXYCODONE HCL 5 MG PO TABS: 10.0000 mg | ORAL_TABLET | ORAL | Status: DC | PRN

## 2021-06-03 MED ORDER — SODIUM CHLORIDE 0.9% FLUSH
INTRAVENOUS | Status: DC | PRN
  Administered 2021-06-03: 30 mL

## 2021-06-03 MED ORDER — LACTATED RINGERS IV BOLUS
500.0000 mL | Freq: Once | INTRAVENOUS | Status: AC
  Administered 2021-06-03: 500 mL via INTRAVENOUS

## 2021-06-03 MED ORDER — SODIUM CHLORIDE (PF) 0.9 % IJ SOLN
INTRAMUSCULAR | Status: AC
  Filled 2021-06-03: qty 30

## 2021-06-03 MED ORDER — ONDANSETRON HCL 4 MG/2ML IJ SOLN
4.0000 mg | Freq: Four times a day (QID) | INTRAMUSCULAR | Status: DC | PRN
  Administered 2021-06-04: 4 mg via INTRAVENOUS
  Filled 2021-06-03: qty 2

## 2021-06-03 MED ORDER — EPHEDRINE SULFATE-NACL 50-0.9 MG/10ML-% IV SOSY
PREFILLED_SYRINGE | INTRAVENOUS | Status: DC | PRN
  Administered 2021-06-03 (×2): 10 mg via INTRAVENOUS

## 2021-06-03 MED ORDER — OXYCODONE HCL 5 MG PO TABS
ORAL_TABLET | ORAL | Status: AC
  Administered 2021-06-03: 5 mg via ORAL
  Filled 2021-06-03: qty 1

## 2021-06-03 MED ORDER — MIDAZOLAM HCL 2 MG/2ML IJ SOLN
1.0000 mg | Freq: Once | INTRAMUSCULAR | Status: AC
  Administered 2021-06-03: 1 mg via INTRAVENOUS

## 2021-06-03 MED ORDER — CHLORHEXIDINE GLUCONATE 0.12 % MT SOLN
15.0000 mL | Freq: Once | OROMUCOSAL | Status: AC
  Administered 2021-06-03: 15 mL via OROMUCOSAL

## 2021-06-03 MED ORDER — ACETAMINOPHEN 500 MG PO TABS: 1000.0000 mg | ORAL_TABLET | Freq: Three times a day (TID) | ORAL | 0 refills | Status: DC

## 2021-06-03 MED ORDER — FENTANYL CITRATE (PF) 100 MCG/2ML IJ SOLN: 25.0000 ug | INTRAMUSCULAR | Status: DC | PRN

## 2021-06-03 MED ORDER — METHOCARBAMOL 500 MG IVPB - SIMPLE MED
500.0000 mg | Freq: Three times a day (TID) | INTRAVENOUS | Status: DC | PRN
  Filled 2021-06-03: qty 50

## 2021-06-03 MED ORDER — POVIDONE-IODINE 10 % EX SWAB: 2.0000 "application " | Freq: Once | CUTANEOUS | Status: DC

## 2021-06-03 MED ORDER — ASPIRIN EC 325 MG PO TBEC
325.0000 mg | DELAYED_RELEASE_TABLET | Freq: Every day | ORAL | Status: DC
  Administered 2021-06-04 – 2021-06-05 (×2): 325 mg via ORAL
  Filled 2021-06-03 (×2): qty 1

## 2021-06-03 MED ORDER — BISACODYL 10 MG RE SUPP: 10.0000 mg | Freq: Every day | RECTAL | Status: DC | PRN

## 2021-06-03 MED ORDER — METOCLOPRAMIDE HCL 5 MG PO TABS: 5.0000 mg | ORAL_TABLET | Freq: Three times a day (TID) | ORAL | Status: DC | PRN

## 2021-06-03 MED ORDER — HYDROMORPHONE HCL 1 MG/ML IJ SOLN: 0.5000 mg | INTRAMUSCULAR | Status: DC | PRN

## 2021-06-03 MED ORDER — POLYETHYLENE GLYCOL 3350 17 G PO PACK: 17.0000 g | PACK | Freq: Every day | ORAL | Status: DC | PRN

## 2021-06-03 MED ORDER — ALUM & MAG HYDROXIDE-SIMETH 200-200-20 MG/5ML PO SUSP: 30.0000 mL | ORAL | Status: DC | PRN

## 2021-06-03 MED ORDER — TRANEXAMIC ACID-NACL 1000-0.7 MG/100ML-% IV SOLN
1000.0000 mg | INTRAVENOUS | Status: AC
  Administered 2021-06-03: 1000 mg via INTRAVENOUS
  Filled 2021-06-03: qty 100

## 2021-06-03 MED ORDER — OXYCODONE HCL 5 MG/5ML PO SOLN: 5.0000 mg | Freq: Once | ORAL | Status: DC | PRN

## 2021-06-03 MED ORDER — OXYCODONE HCL 5 MG PO TABS
ORAL_TABLET | ORAL | Status: AC
  Filled 2021-06-03: qty 1

## 2021-06-03 MED ORDER — SPIRONOLACTONE 12.5 MG HALF TABLET
12.5000 mg | ORAL_TABLET | Freq: Every day | ORAL | Status: DC
  Administered 2021-06-03 – 2021-06-05 (×3): 12.5 mg via ORAL
  Filled 2021-06-03 (×3): qty 1

## 2021-06-03 MED ORDER — 0.9 % SODIUM CHLORIDE (POUR BTL) OPTIME
TOPICAL | Status: DC | PRN
  Administered 2021-06-03: 1000 mL

## 2021-06-03 MED ORDER — ATORVASTATIN CALCIUM 10 MG PO TABS
10.0000 mg | ORAL_TABLET | Freq: Every day | ORAL | Status: DC
  Administered 2021-06-03 – 2021-06-05 (×3): 10 mg via ORAL
  Filled 2021-06-03 (×3): qty 1

## 2021-06-03 MED ORDER — ZOLPIDEM TARTRATE 5 MG PO TABS: 5.0000 mg | ORAL_TABLET | Freq: Every evening | ORAL | Status: DC | PRN

## 2021-06-03 MED ORDER — FENTANYL CITRATE (PF) 100 MCG/2ML IJ SOLN
50.0000 ug | Freq: Once | INTRAMUSCULAR | Status: AC
Start: 2021-06-03 — End: 2021-06-03
  Administered 2021-06-03: 50 ug via INTRAVENOUS

## 2021-06-03 MED ORDER — ONDANSETRON HCL 4 MG PO TABS: 4.0000 mg | ORAL_TABLET | Freq: Three times a day (TID) | ORAL | 0 refills | Status: DC | PRN

## 2021-06-03 MED ORDER — ONDANSETRON HCL 4 MG/2ML IJ SOLN
INTRAMUSCULAR | Status: DC | PRN
  Administered 2021-06-03: 4 mg via INTRAVENOUS

## 2021-06-03 MED ORDER — MEPIVACAINE HCL (PF) 2 % IJ SOLN
INTRAMUSCULAR | Status: DC | PRN
  Administered 2021-06-03: 3 mL via INTRATHECAL

## 2021-06-03 MED ORDER — SODIUM CHLORIDE 0.9 % IR SOLN
Status: DC | PRN
  Administered 2021-06-03: 1000 mL

## 2021-06-03 MED ORDER — ACETAMINOPHEN 325 MG PO TABS: 650.0000 mg | ORAL_TABLET | Freq: Three times a day (TID) | ORAL | Status: DC

## 2021-06-03 MED ORDER — PROPOFOL 500 MG/50ML IV EMUL
INTRAVENOUS | Status: DC | PRN
  Administered 2021-06-03: 30 mg via INTRAVENOUS
  Administered 2021-06-03: 75 ug/kg/min via INTRAVENOUS

## 2021-06-03 MED ORDER — LACTATED RINGERS IV SOLN: INTRAVENOUS | Status: DC

## 2021-06-03 MED ORDER — METHOCARBAMOL 500 MG PO TABS: 500.0000 mg | ORAL_TABLET | Freq: Three times a day (TID) | ORAL | 0 refills | Status: AC | PRN

## 2021-06-03 MED ORDER — MIDAZOLAM HCL 2 MG/2ML IJ SOLN
INTRAMUSCULAR | Status: AC
  Filled 2021-06-03: qty 2

## 2021-06-03 MED ORDER — BUPIVACAINE LIPOSOME 1.3 % IJ SUSP
INTRAMUSCULAR | Status: DC | PRN
  Administered 2021-06-03: 20 mL

## 2021-06-03 MED ORDER — FENTANYL CITRATE (PF) 100 MCG/2ML IJ SOLN
25.0000 ug | INTRAMUSCULAR | Status: DC | PRN
  Administered 2021-06-03: 25 ug via INTRAVENOUS

## 2021-06-03 MED ORDER — DOCUSATE SODIUM 100 MG PO CAPS
100.0000 mg | ORAL_CAPSULE | Freq: Two times a day (BID) | ORAL | Status: DC
  Administered 2021-06-03 – 2021-06-05 (×4): 100 mg via ORAL
  Filled 2021-06-03 (×4): qty 1

## 2021-06-03 MED ORDER — POVIDONE-IODINE 10 % EX SWAB
2.0000 | Freq: Once | CUTANEOUS | Status: AC
Start: 2021-06-03 — End: 2021-06-03
  Administered 2021-06-03: 2 via TOPICAL

## 2021-06-03 MED ORDER — ONDANSETRON HCL 4 MG/2ML IJ SOLN: 4.0000 mg | Freq: Once | INTRAMUSCULAR | Status: DC | PRN

## 2021-06-03 MED ORDER — DEXAMETHASONE SODIUM PHOSPHATE 10 MG/ML IJ SOLN: 8.0000 mg | Freq: Once | INTRAMUSCULAR | Status: DC

## 2021-06-03 MED ORDER — OXYCODONE HCL 5 MG PO TABS
10.0000 mg | ORAL_TABLET | ORAL | Status: DC | PRN
  Administered 2021-06-04 (×2): 15 mg via ORAL
  Filled 2021-06-03 (×2): qty 3

## 2021-06-03 MED ORDER — ONDANSETRON HCL 4 MG PO TABS: 4.0000 mg | ORAL_TABLET | Freq: Four times a day (QID) | ORAL | Status: DC | PRN

## 2021-06-03 MED ORDER — AMLODIPINE BESYLATE 5 MG PO TABS
2.5000 mg | ORAL_TABLET | Freq: Every day | ORAL | Status: DC
  Administered 2021-06-04 – 2021-06-05 (×2): 2.5 mg via ORAL
  Filled 2021-06-03 (×2): qty 1

## 2021-06-03 MED ORDER — METHOCARBAMOL 1000 MG/10ML IJ SOLN
500.0000 mg | Freq: Four times a day (QID) | INTRAVENOUS | Status: DC | PRN
  Filled 2021-06-03: qty 5

## 2021-06-03 MED ORDER — MAGNESIUM CITRATE PO SOLN: 1.0000 | Freq: Once | ORAL | Status: DC | PRN

## 2021-06-03 MED ORDER — ACETAMINOPHEN 500 MG PO TABS
1000.0000 mg | ORAL_TABLET | Freq: Three times a day (TID) | ORAL | Status: AC
  Administered 2021-06-03 – 2021-06-04 (×4): 1000 mg via ORAL
  Filled 2021-06-03 (×4): qty 2

## 2021-06-03 MED ORDER — ORAL CARE MOUTH RINSE: 15.0000 mL | Freq: Once | OROMUCOSAL | Status: AC

## 2021-06-03 MED ORDER — OXYCODONE HCL 5 MG PO TABS
5.0000 mg | ORAL_TABLET | ORAL | Status: DC | PRN
  Administered 2021-06-03: 10 mg via ORAL
  Filled 2021-06-03: qty 2

## 2021-06-03 MED ORDER — MELOXICAM 15 MG PO TABS: 15.0000 mg | ORAL_TABLET | Freq: Every day | ORAL | 0 refills | Status: DC

## 2021-06-03 MED ORDER — OXYCODONE HCL 5 MG PO TABS: ORAL_TABLET | ORAL | 0 refills | Status: DC

## 2021-06-03 MED ORDER — ROPIVACAINE HCL 7.5 MG/ML IJ SOLN
INTRAMUSCULAR | Status: DC | PRN
  Administered 2021-06-03: 20 mL via PERINEURAL

## 2021-06-03 MED ORDER — NAPROXEN 250 MG PO TABS
250.0000 mg | ORAL_TABLET | Freq: Two times a day (BID) | ORAL | Status: DC
  Administered 2021-06-03 – 2021-06-05 (×4): 250 mg via ORAL
  Filled 2021-06-03 (×4): qty 1

## 2021-06-03 MED ORDER — MORPHINE SULFATE (PF) 4 MG/ML IV SOLN: 2.0000 mg | INTRAVENOUS | Status: DC | PRN

## 2021-06-03 MED ORDER — ACETAMINOPHEN 500 MG PO TABS
1000.0000 mg | ORAL_TABLET | Freq: Once | ORAL | Status: AC
  Administered 2021-06-03: 1000 mg via ORAL
  Filled 2021-06-03: qty 2

## 2021-06-03 MED ORDER — ROPINIROLE HCL 1 MG PO TABS
2.0000 mg | ORAL_TABLET | Freq: Every evening | ORAL | Status: DC
  Administered 2021-06-03: 2 mg via ORAL
  Filled 2021-06-03: qty 2

## 2021-06-03 MED ORDER — DIPHENHYDRAMINE HCL 12.5 MG/5ML PO ELIX: 12.5000 mg | ORAL_SOLUTION | ORAL | Status: DC | PRN

## 2021-06-03 MED ORDER — FENTANYL CITRATE (PF) 100 MCG/2ML IJ SOLN
INTRAMUSCULAR | Status: AC
  Administered 2021-06-03: 50 ug via INTRAVENOUS
  Filled 2021-06-03: qty 2

## 2021-06-03 MED ORDER — POTASSIUM CHLORIDE IN NACL 20-0.45 MEQ/L-% IV SOLN
INTRAVENOUS | Status: DC
  Filled 2021-06-03 (×2): qty 1000

## 2021-06-03 MED ORDER — DEXAMETHASONE SODIUM PHOSPHATE 10 MG/ML IJ SOLN
INTRAMUSCULAR | Status: DC | PRN
  Administered 2021-06-03: 10 mg via INTRAVENOUS

## 2021-06-03 MED ORDER — CEFAZOLIN SODIUM-DEXTROSE 2-4 GM/100ML-% IV SOLN
2.0000 g | INTRAVENOUS | Status: AC
  Administered 2021-06-03: 2 g via INTRAVENOUS
  Filled 2021-06-03: qty 100

## 2021-06-03 MED ORDER — METOCLOPRAMIDE HCL 5 MG/ML IJ SOLN
5.0000 mg | Freq: Three times a day (TID) | INTRAMUSCULAR | Status: DC | PRN
  Administered 2021-06-04: 10 mg via INTRAVENOUS
  Filled 2021-06-03: qty 2

## 2021-06-03 MED ORDER — LACTATED RINGERS IV BOLUS
250.0000 mL | Freq: Once | INTRAVENOUS | Status: AC
  Administered 2021-06-03: 250 mL via INTRAVENOUS

## 2021-06-03 MED ORDER — MENTHOL 3 MG MT LOZG: 1.0000 | LOZENGE | OROMUCOSAL | Status: DC | PRN

## 2021-06-03 MED ORDER — METHOCARBAMOL 500 MG PO TABS
500.0000 mg | ORAL_TABLET | Freq: Four times a day (QID) | ORAL | Status: DC | PRN
  Administered 2021-06-04: 500 mg via ORAL
  Filled 2021-06-03 (×2): qty 1

## 2021-06-03 SURGICAL SUPPLY — 52 items
BLADE HEX COATED 2.75 (ELECTRODE) ×2 IMPLANT
BLADE SAG 18X100X1.27 (BLADE) ×2 IMPLANT
BLADE SAGITTAL 25.0X1.37X90 (BLADE) ×2 IMPLANT
BLADE SURG 15 STRL LF DISP TIS (BLADE) ×1 IMPLANT
BLADE SURG 15 STRL SS (BLADE) ×2
BLADE SURG SZ10 CARB STEEL (BLADE) ×4 IMPLANT
BNDG CMPR MED 10X6 ELC LF (GAUZE/BANDAGES/DRESSINGS) ×1
BNDG ELASTIC 6X10 VLCR STRL LF (GAUZE/BANDAGES/DRESSINGS) ×2 IMPLANT
BOWL SMART MIX CTS (DISPOSABLE) IMPLANT
BSPLAT TIB 5 KN TRITANIUM (Knees) ×1 IMPLANT
CLSR STERI-STRIP ANTIMIC 1/2X4 (GAUZE/BANDAGES/DRESSINGS) ×2 IMPLANT
COVER SURGICAL LIGHT HANDLE (MISCELLANEOUS) ×2 IMPLANT
COVER WAND RF STERILE (DRAPES) IMPLANT
CUFF TOURN SGL QUICK 34 (TOURNIQUET CUFF) ×2
CUFF TRNQT CYL 34X4.125X (TOURNIQUET CUFF) ×1 IMPLANT
DECANTER SPIKE VIAL GLASS SM (MISCELLANEOUS) ×1 IMPLANT
DRAPE U-SHAPE 47X51 STRL (DRAPES) ×2 IMPLANT
DRSG MEPILEX BORDER 4X12 (GAUZE/BANDAGES/DRESSINGS) ×2 IMPLANT
DURAPREP 26ML APPLICATOR (WOUND CARE) ×4 IMPLANT
FEMORAL RETAINING CRUC KNEE #4 (Knees) ×1 IMPLANT
GLOVE SRG 8 PF TXTR STRL LF DI (GLOVE) ×1 IMPLANT
GLOVE SURG ENC MOIS LTX SZ7.5 (GLOVE) ×2 IMPLANT
GLOVE SURG POLYISO LF SZ7.5 (GLOVE) ×2 IMPLANT
GLOVE SURG UNDER POLY LF SZ7.5 (GLOVE) ×2 IMPLANT
GLOVE SURG UNDER POLY LF SZ8 (GLOVE) ×2
GOWN STRL REUS W/TWL LRG LVL3 (GOWN DISPOSABLE) ×2 IMPLANT
GOWN STRL REUS W/TWL XL LVL3 (GOWN DISPOSABLE) ×2 IMPLANT
HANDPIECE INTERPULSE COAX TIP (DISPOSABLE) ×2
HOLDER FOLEY CATH W/STRAP (MISCELLANEOUS) ×1 IMPLANT
IMMOBILIZER KNEE 22 UNIV (SOFTGOODS) ×2 IMPLANT
INSERT TRIATH CS X3 11 (Insert) ×1 IMPLANT
KIT TURNOVER KIT A (KITS) ×2 IMPLANT
KNEE PATELLA ASYMMETRIC 9X29 (Knees) ×1 IMPLANT
KNEE TIBIAL COMPONENT SZ5 (Knees) ×1 IMPLANT
MANIFOLD NEPTUNE II (INSTRUMENTS) ×2 IMPLANT
NS IRRIG 1000ML POUR BTL (IV SOLUTION) ×2 IMPLANT
PACK ICE MAXI GEL EZY WRAP (MISCELLANEOUS) ×2 IMPLANT
PACK TOTAL KNEE CUSTOM (KITS) ×2 IMPLANT
PENCIL SMOKE EVACUATOR (MISCELLANEOUS) IMPLANT
PIN FLUTED HEDLESS FIX 3.5X1/8 (PIN) ×1 IMPLANT
PROTECTOR NERVE ULNAR (MISCELLANEOUS) ×2 IMPLANT
SET HNDPC FAN SPRY TIP SCT (DISPOSABLE) ×1 IMPLANT
SUT MNCRL AB 3-0 PS2 18 (SUTURE) ×2 IMPLANT
SUT VIC AB 0 CT1 36 (SUTURE) ×2 IMPLANT
SUT VIC AB 1 CT1 36 (SUTURE) ×4 IMPLANT
SUT VIC AB 2-0 CT1 27 (SUTURE) ×2
SUT VIC AB 2-0 CT1 TAPERPNT 27 (SUTURE) ×1 IMPLANT
TRAY FOLEY MTR SLVR 14FR STAT (SET/KITS/TRAYS/PACK) ×1 IMPLANT
TRAY FOLEY MTR SLVR 16FR STAT (SET/KITS/TRAYS/PACK) IMPLANT
TUBE SUCTION HIGH CAP CLEAR NV (SUCTIONS) ×2 IMPLANT
WATER STERILE IRR 1000ML POUR (IV SOLUTION) ×2 IMPLANT
WRAP KNEE MAXI GEL POST OP (GAUZE/BANDAGES/DRESSINGS) ×1 IMPLANT

## 2021-06-03 NOTE — Evaluation (Signed)
Physical Therapy Evaluation Patient Details Name: Cheryl Chase MRN: [id number] DOB: 1956/02/19 Today's Date: 06/03/2021   History of Present Illness  Patient is 65 y.o. female s/p Lt TKA on 06/03/21 with PMH significant for OA, HTN, HLD, GERD.    Clinical Impression  Cheryl Chase is a 65 y.o. female POD 0 s/p Lt TKA. Patient reports modified independence with mobility and ADL's at baseline using LE's to scoot around in wheelchair in home. Patient is now limited by functional impairments (see PT problem list below) and requires min assist for transfers and gait with RW. Patient was able to ambulate ~35 feet with RW and min assist and was greatly limited by pain. Pt unsafe to attempt stair mobility at this time and has 7-8 steps to enter her sister's home. Patient assisted to [facility] to void and then back to bed for overnight recovery. Patient will benefit from continued skilled PT interventions to address impairments and progress towards PLOF. Acute PT will follow to progress mobility and stair training in preparation for safe discharge home.     Follow Up Recommendations Follow surgeon's recommendation for DC plan and follow-up therapies;Home health PT    Equipment Recommendations  None recommended by PT    Recommendations for Other Services       Precautions / Restrictions Precautions Precautions: Fall Restrictions Weight Bearing Restrictions: No Other Position/Activity Restrictions: WBAT      Mobility  Bed Mobility Overal bed mobility: Needs Assistance Bed Mobility: Supine to Sit;Sit to Supine     Supine to sit: Min assist;HOB elevated Sit to supine: Min assist   General bed mobility comments: Assist for Lt LE to move to EOB and cues for sequencing, pt taking extra time to sit up.    Transfers Overall transfer level: Needs assistance Equipment used: Rolling walker (2 wheeled) Transfers: Sit to/from [employer] Sit to Stand: Min assist;From elevated  surface Stand pivot transfers: Min assist       General transfer comment: cues for safe hand placement for power up and reach back. cues to extend Lt LE to prevent excessive flexion with sitting and reduce pain.  Ambulation/Gait Ambulation/Gait assistance: Min assist Gait Distance (Feet): 35 Feet Assistive device: Rolling walker (2 wheeled) Gait Pattern/deviations: Step-to pattern Gait velocity: decr   General Gait Details: cues for step pattern and proximity RW, assist to position walker. pt limited by pain and seated rest provided.  Stairs            Wheelchair Mobility    Modified Rankin (Stroke Patients Only)       Balance                                             Pertinent Vitals/Pain Pain Assessment: Faces Faces Pain Scale: Hurts whole lot Pain Location: Lt knee Pain Descriptors / Indicators: Aching;Discomfort;Crying;Sore Pain Intervention(s): Limited activity within patient's tolerance;Monitored during session;Repositioned;Ice applied;Patient requesting pain meds-RN notified    Home Living Family/patient expects to be discharged to:: Private residence Living Arrangements: Other relatives   Type of Home: House Home Access: Stairs to enter Entrance Stairs-Rails: Right Entrance Stairs-Number of Steps: 7-8 Home Layout: One level Home Equipment: Walker - 2 wheels;Cane - single point;Bedside commode;Wheelchair - manual Additional Comments: plans to stay with sister for about 1 week: home set up is of sisters house. (Pt's home: pt has 3 steps at  her home with 2 rails, mobile home)    Prior Function Level of Independence: Needs assistance;Independent with assistive device(s)   Gait / Transfers Assistance Needed: pt reports using LE's in wheelchair to scoot around home.     Comments: pt reporting no use of AD for mobility at start of session. later revealed use of wheelchair to scoot herself around home in.     Hand Dominance    Dominant Hand: Right    Extremity/Trunk Assessment   Upper Extremity Assessment Upper Extremity Assessment: Overall WFL for tasks assessed    Lower Extremity Assessment Lower Extremity Assessment: LLE deficits/detail LLE Deficits / Details: good quad acitvation, no extensor lag with SLR, elevation limited by pain LLE: Unable to fully assess due to pain LLE Sensation: WNL LLE Coordination: WNL    Cervical / Trunk Assessment Cervical / Trunk Assessment: Normal  Communication   Communication: No difficulties  Cognition Arousal/Alertness: Awake/alert Behavior During Therapy: WFL for tasks assessed/performed Overall Cognitive Status: Within Functional Limits for tasks assessed                                        General Comments      Exercises     Assessment/Plan    PT Assessment Patient needs continued PT services  PT Problem List Decreased strength;Decreased range of motion;Decreased activity tolerance;Decreased balance;Decreased mobility;Decreased knowledge of use of DME;Decreased knowledge of precautions;Pain       PT Treatment Interventions DME instruction;Gait training;Stair training;Functional mobility training;Therapeutic activities;Therapeutic exercise;Balance training;Patient/family education    PT Goals (Current goals can be found in the Care Plan section)  Acute Rehab PT Goals Patient Stated Goal: get home PT Goal Formulation: With patient Time For Goal Achievement: 06/10/21 Potential to Achieve Goals: Good    Frequency 7X/week   Barriers to discharge   needs stair training: 7-8 steps    Co-evaluation               AM-PAC PT "6 Clicks" Mobility  Outcome Measure Help needed turning from your back to your side while in a flat bed without using bedrails?: A Little Help needed moving from lying on your back to sitting on the side of a flat bed without using bedrails?: A Little Help needed moving to and from a bed to a chair  (including a wheelchair)?: A Little Help needed standing up from a chair using your arms (e.g., wheelchair or bedside chair)?: A Little Help needed to walk in hospital room?: A Little Help needed climbing 3-5 steps with a railing? : A Lot 6 Click Score: 17    End of Session Equipment Utilized During Treatment: Gait belt Activity Tolerance: Patient limited by pain Patient left: in bed;with call bell/phone within reach;with SCD's reapplied Nurse Communication: Mobility status;Patient requests pain meds PT Visit Diagnosis: Muscle weakness (generalized) (M62.81);Difficulty in walking, not elsewhere classified (R26.2);Pain Pain - Right/Left: Left Pain - part of body: Knee    Time: 9892-1194 PT Time Calculation (min) (ACUTE ONLY): 41 min   Charges:   PT Evaluation $PT Eval Low Complexity: 1 Low PT Treatments $Gait Training: 8-22 mins $Therapeutic Activity: 8-22 mins        Wynn Maudlin, DPT Acute Rehabilitation Services Office [phone] Pager [phone]   Anitra Lauth 06/03/2021, 4:16 PM

## 2021-06-03 NOTE — Transfer of Care (Signed)
Immediate Anesthesia Transfer of Care Note  Patient: Cheryl Chase  Procedure(s) Performed: TOTAL KNEE ARTHROPLASTY (Left: Knee)  Patient Location: PACU  Anesthesia Type:Spinal  Level of Consciousness: awake, alert  and patient cooperative  Airway & Oxygen Therapy: Patient Spontanous Breathing and Patient connected to face mask oxygen  Post-op Assessment: Report given to RN and Post -op Vital signs reviewed and stable  Post vital signs: Reviewed and stable  Last Vitals:  Vitals Value Taken Time  BP 120/64 06/03/21 1208  Temp 36.7 C 06/03/21 1208  Pulse 64 06/03/21 1210  Resp 14 06/03/21 1210  SpO2 97 % 06/03/21 1210  Vitals shown include unvalidated device data.  Last Pain:  Vitals:   06/03/21 0910  TempSrc:   PainSc: Asleep         Complications: No notable events documented.

## 2021-06-03 NOTE — Discharge Instructions (Addendum)
Dr. Greig Right Total Joint Replacement Post-Op Instructions  Diet: Return to eating and drinking as you normally would. You will need to add some extra fluids (water) to prevent constipation and dehydration.  WOUND CARE You may leave the operative dressing in place until your follow-up appointment. KEEP THE INCISIONS CLEAN AND DRY. If it fills with liquid or blood please call us immediately to change it for you. Use the provided ice machine or Ice packs as often as possible for the first 3-4 days, then as needed for pain relief.   Keep a layer of cloth or a shirt between your skin and the cooling unit to prevent frost bite as it can get very cold.  SHOWERING: - You may shower on Post-Op Day #2.  - The dressing is water resistant but do not scrub it as it may start to peel up.   - Gently pat the area dry.  - Do not soak the knee in water. Do not go swimming in the pool or ocean until your incision has completely healed. - KEEP THE INCISIONS CLEAN AND DRY.  Sutures: Unless otherwise stated or unforeseen circumstances occur, all of your sutures/stitches will be underneath your skin and dissolve on their own. There will not be anything visible on the outside of the skin and nothing to remove in the office.   Medications: Listed below are the medicines that you may be given after surgery. If you have questions about any of them, please ask.   - DVT (blood clot) prevention: Take as directed by your surgeon. Usually Aspirin  twice a day for 30 days. Early and frequent mobilization and compression stockings will also help to prevent blood clots.  - Zofran: Medicine for nausea and vomiting. Anesthesia, narcotic pain medicines, and/or GI distress can cause nausea and vomiting. Take this only if you need it. - Omeprazole: This is for gastric protection while you are taking the pain medicines to prevent the formation of stomach ulcers or bleeding.  - Acetaminophen (Tylenol): Medicine for mild to  moderate pain. The maximum daily amount you can take is 4,000mg .  - NSAIDs: Celebrex, Mobic, Ibuprofen. Non-steroidal anti-inflammatory medicine that is used to reduce pain and swelling. Do not take additional Ibuprofen or Aleve with these medicines.  - Gabapentin: this is a non-narcotic medication to help with your pain, take on a scheduled basis  - This medication can make you feel drowsy, so start taking it at night time  - Then you may increase to three times a day as you tolerate - Narcotic pain medicines: Oxycodone, Hydrocodone, Tramadol.   - This medication can make you feel drowsy, nauseous, and constipated  - Take only as needed for severe pain  - You may take 1/2 or a full tablet as needed - These are for severe pain not controlled by Tylenol or NSAIDs, take only as needed - These medicines cannot be refilled over the weekend or after hours.  - We can only prescribe 7 days of these medicines due to [place] law.  - Try to wean off this medicine as soon as you are able.  - Muscle Relaxer: Robaxin or Baclofen. These are medicines used to treat pain caused by muscle spasms. Use caution as these medications may make you sleepy.  Constipation: This is common after surgery and while taking narcotic pain medicines. To prevent this, increase your daily fluid intake and dietary fiber intake. You may also use prune juice, Colace, or Miralax until bowel movements are regular.  You getting up and moving will also help with constipation.   Precautions: If you experience sudden onset of chest pain or sudden shortness of breath, call 911 immediately! Be careful when squatting down to get on the toilet. Do not go too low. Do not keep your feet planted while you turn your body. This is usually how post-op joint dislocation or fracture occurs.   Other issues: If you experience a fever over 101.4 F, increased swelling at surgical site, increased pain, wound drainage, please contact the office or come to our  urgent care to be seen. We would prefer to care for you in our office rather than a trip to the ER.   Compression Stockings: These are tight white stockings placed on your legs at the hospital. They help reduce swelling and prevent blood clots. Wear these during the day and take them off at night. Wear these at least until your first post-op visit if possible.   Equipment Needs: You will need a walker with front wheels only. If you do not have one, let us know and we will order one for you. We do not recommend 4 wheeled walkers for post-op recovery. They roll too fast and may cause falls.  You may want an elevated toilet seat or 3 and 1. We do not want you to use this by your bed. Place it over your toilet seat to raise it up so you do not have to bend down too far to reach the seat. In some cases, this is not covered by insurance. It may be less expensive for you to purchase it on your own.   Shower chairs/tub seats are not covered by [employer]. They are relatively inexpensive and can be purchased at most stores should you need them.  Single prong cane: transition to this from the walker if needed. The physical therapist can help you determine if you need it and how to use it properly.  Activity: It is important to be up and moving at least every hour to keep from getting stiff. Recovery from joint replacement is an active process. Performing your normal daily activities is part of your therapy and will help you to recover. You have no weight bearing restrictions unless told otherwise. Please get up and walk every hour. Meals should be eaten at the table, not in bed. Take deep breaths and use the incentive spirometer given to you in the hospital to prevent pneumonia.   For Total Hip Replacements: We encourage walking. It is the best therapy for your recovery. Do not overdo it. Your therapist will give you exercises to do in addition to walking. For Total Knee Replacements: Regaining  flexion and extension (bending and straightening) require you to work and often have a schedule on how to get it all done in a day.  - CPM (Continuous passive motion) Machine: If ordered by your surgeon, it will be delivered to the surgical center or your home prior to your surgery. The machine moves your leg for you from 0 to 90 degrees. You will need to use it 2 hours at a time (early morning, afternoon, and evening) for a total of 6 hours a day. It is best to use it while lying flat on your back. Use it for 2 weeks.  Ice Therapy: Applying ice is good to decrease swelling which will decrease pain. Do not put ice directly on top of the incision. Ice for 20-30 minutes at a time several times a day.  Post-op Follow up: Your first office visit after surgery should already be scheduled prior to your surgery day so it will be on the books. We usually see patients 7-10 days after surgery. Any home health or outpatient physical therapy arrangements will be discussed with you and arranged as needed. Not every patient chooses to go to physical therapy, but we recommend it.   Dental Work: If possible, delay routine dental cleanings for 3 months after surgery. You must take antibiotics 1 hour prior to any dental procedure (including cleanings) for at least 2 years after your total joint surgery. This is to prevent bacteria and germs in your mouth from getting into bleeding vessels and traveling to your joint causing it to become infected. Sometimes your dentist will supply the antibiotic. If not, contact our office and we will send it.   If you have any questions or concerns, please contact our office at [phone] and we will be happy to help you. There is a provider on call 24/7 for any issues. Thank you for choosing our office to care for you!   Dr. [facility]   INSTRUCTIONS AFTER JOINT REPLACEMENT   Remove items at home which could result in a fall. This includes throw  rugs or furniture in walking pathways ICE to the affected joint every three hours while awake for 30 minutes at a time, for at least the first 3-5 days, and then as needed for pain and swelling.  Continue to use ice for pain and swelling. You may notice swelling that will progress down to the foot and ankle.  This is normal after surgery.  Elevate your leg when you are not up walking on it.   Continue to use the breathing machine you got in the hospital (incentive spirometer) which will help keep your temperature down.  It is common for your temperature to cycle up and down following surgery, especially at night when you are not up moving around and exerting yourself.  The breathing machine keeps your lungs expanded and your temperature down.   DIET:  As you were doing prior to hospitalization, we recommend a well-balanced diet.  DRESSING / WOUND CARE / SHOWERING  You may change your dressing 3-5 days after surgery.  Then change the dressing every day with sterile gauze.  Please use good hand washing techniques before changing the dressing.  Do not use any lotions or creams on the incision until instructed by your surgeon.  ACTIVITY  Increase activity slowly as tolerated, but follow the weight bearing instructions below.   No driving for 6 weeks or until further direction given by your physician.  You cannot drive while taking narcotics.  No lifting or carrying greater than 10 lbs. until further directed by your surgeon. Avoid periods of inactivity such as sitting longer than an hour when not asleep. This helps prevent blood clots.  You may return to work once you are authorized by your doctor.     WEIGHT BEARING   Weight bearing as tolerated with assist device (walker, cane, etc) as directed, use it as long as suggested by your surgeon or therapist, typically at least 4-6 weeks.   EXERCISES  Results after joint replacement surgery are often greatly improved when you follow the exercise,  range of motion and muscle strengthening exercises prescribed by your doctor. Safety measures are also important to protect the joint from further injury. Any time any of these exercises cause you to have increased pain or  swelling, decrease what you are doing until you are comfortable again and then slowly increase them. If you have problems or questions, call your caregiver or physical therapist for advice.   Rehabilitation is important following a joint replacement. After just a few days of immobilization, the muscles of the leg can become weakened and shrink (atrophy).  These exercises are designed to build up the tone and strength of the thigh and leg muscles and to improve motion. Often times heat used for twenty to thirty minutes before working out will loosen up your tissues and help with improving the range of motion but do not use heat for the first two weeks following surgery (sometimes heat can increase post-operative swelling).   These exercises can be done on a training (exercise) mat, on the floor, on a table or on a bed. Use whatever works the best and is most comfortable for you.    Use music or television while you are exercising so that the exercises are a pleasant break in your day. This will make your life better with the exercises acting as a break in your routine that you can look forward to.   Perform all exercises about fifteen times, three times per day or as directed.  You should exercise both the operative leg and the other leg as well.  Exercises include:   Quad Sets - Tighten up the muscle on the front of the thigh (Quad) and hold for 5-10 seconds.   Straight Leg Raises - With your knee straight (if you were given a brace, keep it on), lift the leg to 60 degrees, hold for 3 seconds, and slowly lower the leg.  Perform this exercise against resistance later as your leg gets stronger.  Leg Slides: Lying on your back, slowly slide your foot toward your buttocks, bending your knee  up off the floor (only go as far as is comfortable). Then slowly slide your foot back down until your leg is flat on the floor again.  Angel Wings: Lying on your back spread your legs to the side as far apart as you can without causing discomfort.  Hamstring Strength:  Lying on your back, push your heel against the floor with your leg straight by tightening up the muscles of your buttocks.  Repeat, but this time bend your knee to a comfortable angle, and push your heel against the floor.  You may put a pillow under the heel to make it more comfortable if necessary.   A rehabilitation program following joint replacement surgery can speed recovery and prevent re-injury in the future due to weakened muscles. Contact your doctor or a physical therapist for more information on knee rehabilitation.    CONSTIPATION  Constipation is defined medically as fewer than three stools per week and severe constipation as less than one stool per week.  Even if you have a regular bowel pattern at home, your normal regimen is likely to be disrupted due to multiple reasons following surgery.  Combination of anesthesia, postoperative narcotics, change in appetite and fluid intake all can affect your bowels.   YOU MUST use at least one of the following options; they are listed in order of increasing strength to get the job done.  They are all available over the counter, and you may need to use some, POSSIBLY even all of these options:    Drink plenty of fluids (prune juice may be helpful) and high fiber foods Colace 100 mg by mouth twice a day  Senokot for constipation as directed and as needed Dulcolax (bisacodyl), take with full glass of water  Miralax (polyethylene glycol) once or twice a day as needed.  If you have tried all these things and are unable to have a bowel movement in the first 3-4 days after surgery call either your surgeon or your primary doctor.    If you experience loose stools or diarrhea, hold the  medications until you stool forms back up.  If your symptoms do not get better within 1 week or if they get worse, check with your doctor.  If you experience "the worst abdominal pain ever" or develop nausea or vomiting, please contact the office immediately for further recommendations for treatment.   ITCHING:  If you experience itching with your medications, try taking only a single pain pill, or even half a pain pill at a time.  You can also use Benadryl over the counter for itching or also to help with sleep.   TED HOSE STOCKINGS:  Use stockings on both legs until for at least 2 weeks or as directed by physician office. They may be removed at night for sleeping.  MEDICATIONS:  See your medication summary on the "After Visit Summary" that nursing will review with you.  You may have some home medications which will be placed on hold until you complete the course of blood thinner medication.  It is important for you to complete the blood thinner medication as prescribed.  PRECAUTIONS:  If you experience chest pain or shortness of breath - call 911 immediately for transfer to the hospital emergency department.   If you develop a fever greater that 101 F, purulent drainage from wound, increased redness or drainage from wound, foul odor from the wound/dressing, or calf pain - CONTACT YOUR SURGEON.                                                   FOLLOW-UP APPOINTMENTS:  If you do not already have a post-op appointment, please call the office for an appointment to be seen by your surgeon.  Guidelines for how soon to be seen are listed in your "After Visit Summary", but are typically between 1-4 weeks after surgery.  OTHER INSTRUCTIONS:   Knee Replacement:  Do not place pillow under knee, focus on keeping the knee straight while resting. CPM instructions: 0-90 degrees, 2 hours in the morning, 2 hours in the afternoon, and 2 hours in the evening. Place foam block, curve side up under heel at all times  except when in CPM or when walking.  DO NOT modify, tear, cut, or change the foam block in any way.  POST-OPERATIVE OPIOID TAPER INSTRUCTIONS: It is important to wean off of your opioid medication as soon as possible. If you do not need pain medication after your surgery it is ok to stop day one. Opioids include: Codeine, Hydrocodone(Norco, Vicodin), Oxycodone(Percocet, oxycontin) and hydromorphone amongst others.  Long term and even short term use of opiods can cause: Increased pain response Dependence Constipation Depression Respiratory depression And more.  Withdrawal symptoms can include Flu like symptoms Nausea, vomiting And more Techniques to manage these symptoms Hydrate well Eat regular healthy meals Stay active Use relaxation techniques(deep breathing, meditating, yoga) Do Not substitute Alcohol to help with tapering If you have been on opioids for less than two weeks and  do not have pain than it is ok to stop all together.  Plan to wean off of opioids This plan should start within one week post op of your joint replacement. Maintain the same interval or time between taking each dose and first decrease the dose.  Cut the total daily intake of opioids by one tablet each day Next start to increase the time between doses. The last dose that should be eliminated is the evening dose.   MAKE SURE YOU:  Understand these instructions.  Get help right away if you are not doing well or get worse.    Thank you for letting us be a part of your medical care team.  It is a privilege we respect greatly.  We hope these instructions will help you stay on track for a fast and full recovery!

## 2021-06-03 NOTE — Anesthesia Procedure Notes (Signed)
Spinal  Patient location during procedure: OR Start time: 06/03/2021 10:02 AM Reason for block: surgical anesthesia Staffing Performed: resident/CRNA  Anesthesiologist: Audry Pili, MD Resident/CRNA: Gerald Leitz, CRNA Preanesthetic Checklist Completed: patient identified, IV checked, site marked, risks and benefits discussed, surgical consent, monitors and equipment checked, pre-op evaluation and timeout performed Spinal Block Patient position: sitting Prep: Betadine Patient monitoring: heart rate, continuous pulse ox, blood pressure and cardiac monitor Approach: midline Location: L3-4 Injection technique: single-shot Needle Needle type: Introducer and Pencan  Needle gauge: 24 G Needle length: 9 cm Assessment Sensory level: T4 Events: CSF return Additional Notes Negative paresthesia. Negative blood return. Positive free-flowing CSF. Expiration date of kit checked and confirmed. Patient tolerated procedure well, without complications.

## 2021-06-03 NOTE — Anesthesia Postprocedure Evaluation (Signed)
Anesthesia Post Note  Patient: Cheryl Chase  Procedure(s) Performed: TOTAL KNEE ARTHROPLASTY (Left: Knee)     Patient location during evaluation: PACU Anesthesia Type: Spinal Level of consciousness: awake and alert Pain management: pain level controlled Vital Signs Assessment: post-procedure vital signs reviewed and stable Respiratory status: spontaneous breathing and respiratory function stable Cardiovascular status: blood pressure returned to baseline and stable Postop Assessment: spinal receding and no apparent nausea or vomiting Anesthetic complications: no   No notable events documented.  Last Vitals:  Vitals:   06/03/21 1345 06/03/21 1402  BP:    Pulse: 63   Resp: 13   Temp:    SpO2: 97% 92%    Last Pain:  Vitals:   06/03/21 1402  TempSrc:   PainSc: 3                  Beryle Lathe

## 2021-06-03 NOTE — Interval H&P Note (Signed)
History and Physical Interval Note:  06/03/2021 9:09 AM  Edman Circle  has presented today for surgery, with the diagnosis of OA LEFT KNEE.  The various methods of treatment have been discussed with the patient and family. After consideration of risks, benefits and other options for treatment, the patient has consented to  Procedure(s): TOTAL KNEE ARTHROPLASTY (Left) as a surgical intervention.  The patient's history has been reviewed, patient examined, no change in status, stable for surgery.  I have reviewed the patient's chart and labs.  Questions were answered to the patient's satisfaction.     Sheral Apley

## 2021-06-03 NOTE — Op Note (Signed)
DATE OF SURGERY:  06/03/2021 TIME: 11:16 AM  PATIENT NAME:  Cheryl Chase   AGE: 65 y.o.    PRE-OPERATIVE DIAGNOSIS:  OA LEFT KNEE  POST-OPERATIVE DIAGNOSIS:  Same  PROCEDURE:  Procedure(s): TOTAL KNEE ARTHROPLASTY   SURGEON:  Sheral Apley, MD   ASSISTANT:  Levester Fresh, PA-C, he was present and scrubbed throughout the case, critical for completion in a timely fashion, and for retraction, instrumentation, and closure.    OPERATIVE IMPLANTS: [employer] Triathlon CR. Press fit knee  Femur size 4, Tibia size 5, Patella size 29 3-peg oval button, with a 11 mm polyethylene insert.   PREOPERATIVE INDICATIONS:  [name] is a 65 y.o. year old female with end stage bone on bone degenerative arthritis of the knee who failed conservative treatment, including injections, antiinflammatories, activity modification, and assistive devices, and had significant impairment of their activities of daily living, and elected for Total Knee Arthroplasty.   The risks, benefits, and alternatives were discussed at length including but not limited to the risks of infection, bleeding, nerve injury, stiffness, blood clots, the need for revision surgery, cardiopulmonary complications, among others, and they were willing to proceed.   OPERATIVE DESCRIPTION:  The patient was brought to the operative room and placed in a supine position.  General anesthesia was administered.  IV antibiotics were given.  The lower extremity was prepped and draped in the usual sterile fashion.  Time out was performed.  The leg was elevated and exsanguinated and the tourniquet was inflated.  Anterior approach was performed.  The patella was everted and osteophytes were removed.  The anterior horn of the medial and lateral meniscus was removed.   The distal femur was opened with the drill and the intramedullary distal femoral cutting jig was utilized, set at 5 degrees resecting 9 mm off the distal femur.  Care was taken  to protect the collateral ligaments.  The distal femoral sizing jig was applied, taking care to avoid notching.  Then the 4-in-1 cutting jig was applied and the anterior and posterior femur was cut, along with the chamfer cuts.  All posterior osteophytes were removed.  The flexion gap was then measured and was symmetric with the extension gap.  Then the extramedullary tibial cutting jig was utilized making the appropriate cut using the anterior tibial crest as a reference building in appropriate posterior slope.  Care was taken during the cut to protect the medial and collateral ligaments.  The proximal tibia was removed along with the posterior horns of the menisci.  The PCL was sacrificed.    The extensor gap was measured and was approximately 24mm.    I completed the distal femoral preparation using the appropriate jig to prepare the box.  The patella was then measured, and cut with the saw.    The proximal tibia sized and prepared accordingly with the reamer and the punch, and then all components were trialed with the above sized poly insert.  The knee was found to have excellent balance and full motion.    The above named components were then impacted into place and Poly tibial piece and patella were inserted.  I was very happy with his stability and ROM  I performed a periarticular injection with marcaine and toradol  The knee was easily taken through a range of motion and the patella tracked well and the knee irrigated copiously and the parapatellar and subcutaneous tissue closed with vicryl, and monocryl with steri strips for the skin.  The  incision was dressed with sterile gauze and the tourniquet released and the patient was awakened and returned to the PACU in stable and satisfactory condition.  There were no complications.  Total tourniquet time was roughly 60 minutes.   POSTOPERATIVE PLAN: post op Abx, DVT px: SCD's, TED's, Early ambulation and chemical px

## 2021-06-03 NOTE — Plan of Care (Signed)

## 2021-06-03 NOTE — Progress Notes (Signed)
AssistedDr. Brock with left, ultrasound guided, adductor canal block. Side rails up, monitors on throughout procedure. See vital signs in flow sheet. Tolerated Procedure well.  

## 2021-06-03 NOTE — Anesthesia Procedure Notes (Signed)
Anesthesia Regional Block: Adductor canal block   Pre-Anesthetic Checklist: , timeout performed,  Correct Patient, Correct Site, Correct Laterality,  Correct Procedure, Correct Position, site marked,  Risks and benefits discussed,  Surgical consent,  Pre-op evaluation,  At surgeon's request and post-op pain management  Laterality: Left  Prep: chloraprep       Needles:  Injection technique: Single-shot  Needle Type: Echogenic Needle     Needle Length: 10cm  Needle Gauge: 21     Additional Needles:   Narrative:  Start time: 06/03/2021 9:06 AM End time: 06/03/2021 9:09 AM Injection made incrementally with aspirations every 5 mL.  Performed by: Personally  Anesthesiologist: Beryle Lathe, MD  Additional Notes: No pain on injection. No increased resistance to injection. Injection made in 5cc increments. Good needle visualization. Patient tolerated the procedure well.

## 2021-06-04 ENCOUNTER — Encounter (HOSPITAL_COMMUNITY): Payer: Self-pay | Admitting: Orthopedic Surgery

## 2021-06-04 DIAGNOSIS — M1712 Unilateral primary osteoarthritis, left knee: Secondary | ICD-10-CM | POA: Diagnosis not present

## 2021-06-04 LAB — CBC
HCT: 36.5 % (ref 36.0–46.0)
Hemoglobin: 11.9 g/dL — ABNORMAL LOW (ref 12.0–15.0)
MCH: 28.1 pg (ref 26.0–34.0)
MCHC: 32.6 g/dL (ref 30.0–36.0)
MCV: 86.1 fL (ref 80.0–100.0)
Platelets: 202 10*3/uL (ref 150–400)
RBC: 4.24 MIL/uL (ref 3.87–5.11)
RDW: 12.8 % (ref 11.5–15.5)
WBC: 16.9 10*3/uL — ABNORMAL HIGH (ref 4.0–10.5)
nRBC: 0 % (ref 0.0–0.2)

## 2021-06-04 LAB — BASIC METABOLIC PANEL
Anion gap: 7 (ref 5–15)
BUN: 16 mg/dL (ref 8–23)
CO2: 30 mmol/L (ref 22–32)
Calcium: 9.6 mg/dL (ref 8.9–10.3)
Chloride: 102 mmol/L (ref 98–111)
Creatinine, Ser: 0.8 mg/dL (ref 0.44–1.00)
GFR, Estimated: >60 mL/min (ref >60)
Glucose, Bld: 167 mg/dL — ABNORMAL HIGH (ref 70–99)
Potassium: 4.6 mmol/L (ref 3.5–5.1)
Sodium: 139 mmol/L (ref 135–145)

## 2021-06-04 MED ORDER — OXYCODONE HCL 5 MG PO TABS
5.0000 mg | ORAL_TABLET | ORAL | Status: DC | PRN
Start: 2021-06-04 — End: 2021-06-05

## 2021-06-04 MED ORDER — GABAPENTIN 100 MG PO CAPS: 100.0000 mg | ORAL_CAPSULE | Freq: Three times a day (TID) | ORAL | 0 refills | Status: AC

## 2021-06-04 MED ORDER — OMEPRAZOLE 20 MG PO CPDR: 20.0000 mg | DELAYED_RELEASE_CAPSULE | Freq: Every day | ORAL | 0 refills | Status: DC

## 2021-06-04 MED ORDER — OXYCODONE HCL 5 MG PO TABS: 10.0000 mg | ORAL_TABLET | ORAL | Status: DC | PRN

## 2021-06-04 MED ORDER — HYDROMORPHONE HCL 1 MG/ML IJ SOLN: 0.2500 mg | INTRAMUSCULAR | Status: DC | PRN

## 2021-06-04 MED ORDER — ROPINIROLE HCL 1 MG PO TABS: 2.0000 mg | ORAL_TABLET | Freq: Every evening | ORAL | Status: DC | PRN

## 2021-06-04 NOTE — Plan of Care (Signed)
  Problem: Activity: Goal: Ability to avoid complications of mobility impairment will improve Outcome: Progressing   Problem: Clinical Measurements: Goal: Postoperative complications will be avoided or minimized Outcome: Progressing   Problem: Pain Management: Goal: Pain level will decrease with appropriate interventions Outcome: Progressing   Problem: Skin Integrity: Goal: Will show signs of wound healing Outcome: Progressing   Problem: Education: Goal: Knowledge of General Education information will improve Description: Including pain rating scale, medication(s)/side effects and non-pharmacologic comfort measures Outcome: Progressing   

## 2021-06-04 NOTE — TOC Initial Note (Signed)
Transition of Care [facility]) - Initial/Assessment Note   Patient Details  Name: Cheryl Chase MRN: [id number] Date of Birth: 1956-02-14  [facility] CM/SW Contact:    Sherie Don, LCSW Phone Number: 06/04/2021, 3:55 PM  Clinical Narrative: Patient is expected to discharge when cleared by PT as patient did not do well with PT today. CSW met with patient to confirm discharge plan. Patient has a rolling walker, 3N1, and CPM at home so there are no DME needs at this time. Patient was supposed to do OPPT, but patient reported she is the only person in her home that drives so she cannot drive to OPPT after discharge and is requesting HHPT. Due to patient's location, there are limited [facility] agency options. CSW made referral to [facility] for review.  Expected Discharge Plan: [place] Barriers to Discharge: Continued Medical Work up  Patient Goals and CMS Choice Patient states their goals for this hospitalization and ongoing recovery are:: Go home with [place] CMS Medicare.gov Compare Post Acute Care list provided to:: Patient Choice offered to / list presented to : Patient  Expected Discharge Plan and Services Expected Discharge Plan: [place] In-house Referral: Clinical Social Work Post Acute Care Choice: [employer] arrangements for the past 2 months: [place] Expected Discharge Date: 06/04/21               DME Arranged: N/A DME Agency: NA HH Arranged: PT  Prior Living Arrangements/Services Living arrangements for the past 2 months: Single Family Home Patient language and need for interpreter reviewed:: Yes Do you feel safe going back to the place where you live?: Yes      Need for Family Participation in Patient Care: No (Comment) Care giver support system in place?: Yes (comment) Current home services: DME (CPM, 3N1, RW) Criminal Activity/Legal Involvement Pertinent to Current Situation/Hospitalization: No - Comment as  needed  Activities of Daily Living Home Assistive Devices/Equipment: Eyeglasses, Dentures (specify type) ADL Screening (condition at time of admission) Patient's cognitive ability adequate to safely complete daily activities?: Yes Is the patient deaf or have difficulty hearing?: No Does the patient have difficulty seeing, even when wearing glasses/contacts?: No Does the patient have difficulty concentrating, remembering, or making decisions?: No Patient able to express need for assistance with ADLs?: Yes Does the patient have difficulty dressing or bathing?: No Independently performs ADLs?: Yes (appropriate for developmental age) Does the patient have difficulty walking or climbing stairs?: Yes Weakness of Legs: Both Weakness of Arms/Hands: Left  Permission Sought/Granted Permission sought to share information with : Other (comment) Permission granted to share information with : Yes, Verbal Permission Granted Permission granted to share info w AGENCY: [place] agencies  Emotional Assessment Appearance:: Appears stated age Attitude/Demeanor/Rapport: Engaged Affect (typically observed): Accepting Orientation: : Oriented to Self, Oriented to Place, Oriented to  Time, Oriented to Situation Alcohol / Substance Use: Tobacco Use Psych Involvement: No (comment)  Admission diagnosis:  Status post left knee replacement [Z96.652] Patient Active Problem List   Diagnosis Date Noted   Status post left knee replacement 06/03/2021   History of syncope 06/20/2019   History of cardiomyopathy 06/20/2019   Abnormal cardiovascular function study 12/27/2014   Abnormal result of cardiovascular function study 12/24/2014   Essential hypertension 12/24/2014   GERD (gastroesophageal reflux disease) 12/24/2014   Chest pain 12/24/2014   Atherosclerosis 12/24/2014   Overweight 12/24/2014   Hyperlipidemia 12/24/2014   Lumbar disc disease 12/24/2014   PCP:  Leonard Downing, MD Pharmacy:   [facility] [place]), [place] - [address] [place] ([place]) [place] [address] Phone: [phone] Fax: [place]), [place] - [address] [place] [address] Phone: [phone] Fax: [phone]  Readmission Risk Interventions No flowsheet data found.

## 2021-06-04 NOTE — Progress Notes (Signed)
Physical Therapy Treatment Patient Details Name: Cheryl Chase MRN: [id number] DOB: 07/05/1956 Today's Date: 06/04/2021    History of Present Illness Patient is 65 y.o. female s/p Lt TKA on 06/03/21 with PMH significant for OA, HTN, HLD, GERD.    PT Comments    Pt continues to progress slowly, SpO2=86% on RA, O2 replaced at 2L. Pt nausea improved although she is extremely sleepy and needing repetitious multi-modal cues to stay awake for exercises. Able to amb ~ 5' however shaky and falling asleep. Has not eaten. Pt unsafe to d/c home today. RN aware.   Follow Up Recommendations  Follow surgeon's recommendation for DC plan and follow-up therapies;Home health PT     Equipment Recommendations  None recommended by PT    Recommendations for Other Services       Precautions / Restrictions Precautions Precautions: Fall Restrictions Weight Bearing Restrictions: No LLE Weight Bearing: Weight bearing as tolerated    Mobility  Bed Mobility Overal bed mobility: Needs Assistance Bed Mobility: Supine to Sit     Supine to sit: HOB elevated;Min guard     General bed mobility comments: incr time, effortful transition    Transfers Overall transfer level: Needs assistance Equipment used: Rolling walker (2 wheeled) Transfers: Sit to/from [employer] Sit to Stand: Min assist Stand pivot transfers: Min assist       General transfer comment: cues for hand placement and LLE position  Ambulation/Gait Ambulation/Gait assistance: Min assist Gait Distance (Feet): 5 Feet Assistive device: Rolling walker (2 wheeled) Gait Pattern/deviations: Step-to pattern     General Gait Details: cues for sequence and RW position, pt shaky, closing eyes in standing, UEs tremulous   Stairs             Wheelchair Mobility    Modified Rankin (Stroke Patients Only)       Balance                                            Cognition  Arousal/Alertness: Suspect due to medications (very sleepy) Behavior During Therapy: WFL for tasks assessed/performed Overall Cognitive Status: Within Functional Limits for tasks assessed                                        Exercises Total Joint Exercises Ankle Circles/Pumps: AROM;Both;10 reps Quad Sets: AROM;Both;10 reps;Limitations Quad Sets Limitations: falling asleep Heel Slides: AAROM;Left;10 reps;Limitations Heel Slides Limitations: requiring cues to stay awake    General Comments        Pertinent Vitals/Pain Pain Assessment: Faces Pain Score: 4  Pain Location: Lt knee Pain Descriptors / Indicators: Aching;Discomfort;Crying;Sore Pain Intervention(s): Limited activity within patient's tolerance;Monitored during session;Repositioned    Home Living                      Prior Function            PT Goals (current goals can now be found in the care plan section) Acute Rehab PT Goals Patient Stated Goal: get home PT Goal Formulation: With patient Time For Goal Achievement: 06/10/21 Potential to Achieve Goals: Good Progress towards PT goals: Progressing toward goals (slowly)    Frequency    7X/week      PT Plan Current plan remains appropriate  Co-evaluation              AM-PAC PT "6 Clicks" Mobility   Outcome Measure  Help needed turning from your back to your side while in a flat bed without using bedrails?: A Little Help needed moving from lying on your back to sitting on the side of a flat bed without using bedrails?: A Little Help needed moving to and from a bed to a chair (including a wheelchair)?: A Little Help needed standing up from a chair using your arms (e.g., wheelchair or bedside chair)?: A Little Help needed to walk in hospital room?: A Lot Help needed climbing 3-5 steps with a railing? : A Lot 6 Click Score: 16    End of Session Equipment Utilized During Treatment: Gait belt Activity Tolerance: Patient  limited by fatigue Patient left: with call bell/phone within reach;in chair;with chair alarm set;with family/visitor present   PT Visit Diagnosis: Muscle weakness (generalized) (M62.81);Difficulty in walking, not elsewhere classified (R26.2);Pain Pain - Right/Left: Left Pain - part of body: Knee     Time: 0254-2706 PT Time Calculation (min) (ACUTE ONLY): 14 min  Charges:  $Gait Training: 8-22 mins $Therapeutic Activity: 23-37 mins                     Delice Bison, PT  Acute Rehab Dept ([facility]) [phone] Pager [phone]  06/04/2021    [facility] 06/04/2021, 3:03 PM

## 2021-06-04 NOTE — Progress Notes (Signed)
Physical Therapy Treatment Patient Details Name: Cheryl Chase MRN: [id number] DOB: 07-05-1956 Today's Date: 06/04/2021    History of Present Illness Patient is 65 y.o. female s/p Lt TKA on 06/03/21 with PMH significant for OA, HTN, HLD, GERD.    PT Comments    Pt limited by nausea and dizziness. Unable to fully sit EOB, unable to transfer to chair. RN gave meds for nausea, will attempt again this pm. At this time it appears pt will not be safe for d/c home today.    Follow Up Recommendations  Follow surgeon's recommendation for DC plan and follow-up therapies;Home health PT     Equipment Recommendations  None recommended by PT    Recommendations for Other Services       Precautions / Restrictions Precautions Precautions: Fall Restrictions Weight Bearing Restrictions: No LLE Weight Bearing: Weight bearing as tolerated    Mobility  Bed Mobility Overal bed mobility: Needs Assistance Bed Mobility: Supine to Sit     Supine to sit: Min assist;HOB elevated     General bed mobility comments: pt came to partial sit and then became dizzy, immediately returning to supine. pt then vomited. RN made aware. BP 128/82    Transfers                 General transfer comment: unable d/t dizziness and nausea  Ambulation/Gait                 Stairs             Wheelchair Mobility    Modified Rankin (Stroke Patients Only)       Balance                                            Cognition Arousal/Alertness: Awake/alert Behavior During Therapy: WFL for tasks assessed/performed Overall Cognitive Status: Within Functional Limits for tasks assessed                                        Exercises Total Joint Exercises Ankle Circles/Pumps: AROM;Both;10 reps    General Comments        Pertinent Vitals/Pain Pain Assessment: Faces Pain Location: Lt knee Pain Descriptors / Indicators:  Aching;Discomfort;Crying;Sore Pain Intervention(s): Limited activity within patient's tolerance;Monitored during session;Premedicated before session;Repositioned    Home Living                      Prior Function            PT Goals (current goals can now be found in the care plan section) Acute Rehab PT Goals Patient Stated Goal: get home PT Goal Formulation: With patient Time For Goal Achievement: 06/10/21 Potential to Achieve Goals: Good Progress towards PT goals: Not progressing toward goals - comment (medical issues)    Frequency    7X/week      PT Plan Current plan remains appropriate    Co-evaluation              AM-PAC PT "6 Clicks" Mobility   Outcome Measure  Help needed turning from your back to your side while in a flat bed without using bedrails?: A Little Help needed moving from lying on your back to sitting on the side of a flat bed without using  bedrails?: A Little Help needed moving to and from a bed to a chair (including a wheelchair)?: A Lot Help needed standing up from a chair using your arms (e.g., wheelchair or bedside chair)?: A Lot Help needed to walk in hospital room?: A Lot Help needed climbing 3-5 steps with a railing? : A Lot 6 Click Score: 14    End of Session   Activity Tolerance: Treatment limited secondary to medical complications (Comment) Patient left: in bed;with call bell/phone within reach;with bed alarm set;with nursing/sitter in room   PT Visit Diagnosis: Muscle weakness (generalized) (M62.81);Difficulty in walking, not elsewhere classified (R26.2);Pain Pain - Right/Left: Left Pain - part of body: Knee     Time: 1040-1104 PT Time Calculation (min) (ACUTE ONLY): 24 min  Charges:  $Therapeutic Activity: 23-37 mins                     Delice Bison, PT  Acute Rehab Dept ([facility]) [phone] Pager [phone]  06/04/2021    [facility] 06/04/2021, 12:15 PM

## 2021-06-04 NOTE — Progress Notes (Signed)
   ORTHOPAEDIC PROGRESS NOTE  s/p Procedure(s): TOTAL KNEE ARTHROPLASTY on 6/21 with Dr. Eulah Pont  SUBJECTIVE: Patient reports pain in the knee with activity. No pain at rest. Improving since yesterday. No chest pain. No SOB. No nausea/vomiting. No other complaints.  OBJECTIVE: PE: General: resting in hospital bed, NAD Cardiac: regular rate Pulmonary: no increased work of breathing GI: abdomen soft, non-tender LLE: dressing CDI, tender to palpation throughout the knee, compartments soft and compressible, no pain with passive stretch, + GS/TA/EHL, Sensation intact distally, 2+ DP pulse with warm and well perfused digits.    Vitals:   06/04/21 0030 06/04/21 0506  BP: 115/65 122/65  Pulse: (!) 59 (!) 50  Resp: 17 17  Temp: 97.9 F (36.6 C) 98 F (36.7 C)  SpO2: 96% 100%     ASSESSMENT: Cheryl Chase is a 65 y.o. female doing well postoperatively. POD#1  PLAN: Weightbearing: WBAT LLE Insicional and dressing care: Reinforce dressings as needed Orthopedic device(s): None Showering: Post-op day #3 VTE prophylaxis: Aspirin 81mg  BID Compression stockings. Ambulation Pain control: PRN pain medications, preferring oral medications Follow - up plan: In office with Dr. on 7/6 Contact information:  Dr. 9/6, Renaye Rakers PA-C, After hours and holidays please check [email or web address] for group call information for [facility]  Dispo: Patient had difficulty with PT yesterday after surgery. Requires additional PT today before safely returning home. If does well with physical therapy today, patient will discharge home with family. Patient is in agreement with this plan.   Alfonse Alpers, PA-C 06/04/2021

## 2021-06-05 ENCOUNTER — Ambulatory Visit: Payer: Medicare Other

## 2021-06-05 DIAGNOSIS — M1712 Unilateral primary osteoarthritis, left knee: Secondary | ICD-10-CM | POA: Diagnosis not present

## 2021-06-05 LAB — CBC
HCT: 36.3 % (ref 36.0–46.0)
Hemoglobin: 11.4 g/dL — ABNORMAL LOW (ref 12.0–15.0)
MCH: 28 pg (ref 26.0–34.0)
MCHC: 31.4 g/dL (ref 30.0–36.0)
MCV: 89.2 fL (ref 80.0–100.0)
Platelets: 212 10*3/uL (ref 150–400)
RBC: 4.07 MIL/uL (ref 3.87–5.11)
RDW: 13.1 % (ref 11.5–15.5)
WBC: 19.5 10*3/uL — ABNORMAL HIGH (ref 4.0–10.5)
nRBC: 0 % (ref 0.0–0.2)

## 2021-06-05 MED ORDER — ASPIRIN 81 MG PO CHEW: 81.0000 mg | CHEWABLE_TABLET | Freq: Two times a day (BID) | ORAL | 0 refills | Status: AC

## 2021-06-05 MED ORDER — OXYCODONE HCL 5 MG PO TABS: ORAL_TABLET | ORAL | 0 refills | Status: AC

## 2021-06-05 MED ORDER — ONDANSETRON HCL 4 MG PO TABS: 4.0000 mg | ORAL_TABLET | Freq: Three times a day (TID) | ORAL | 0 refills | Status: AC | PRN

## 2021-06-05 MED ORDER — ACETAMINOPHEN 500 MG PO TABS: 1000.0000 mg | ORAL_TABLET | Freq: Three times a day (TID) | ORAL | 0 refills | Status: AC

## 2021-06-05 MED ORDER — MELOXICAM 15 MG PO TABS: 15.0000 mg | ORAL_TABLET | Freq: Every day | ORAL | 0 refills | Status: AC

## 2021-06-05 MED ORDER — OMEPRAZOLE 20 MG PO CPDR: 20.0000 mg | DELAYED_RELEASE_CAPSULE | Freq: Every day | ORAL | 0 refills | Status: AC

## 2021-06-05 NOTE — Progress Notes (Signed)
   ORTHOPAEDIC PROGRESS NOTE  s/p Procedure(s): TOTAL KNEE ARTHROPLASTY  SUBJECTIVE: Patient feels better today. No pain in the knee. Nausea improved. No chest pain. No SOB. No nausea/vomiting. No abdominal pain. No fever or chills. No other complaints.  OBJECTIVE: PE: General: resting in hospital bed, NAD Cardiac: regular rate Pulmonary: no increased work of breathing GI: abdomen soft, non-tender LLE: dressing CDI, dressing changed, incision CDI, steri strips in place, mild swelling, tender to palpation throughout the knee, compartments soft and compressible, no pain with passive stretch, + GS/TA/EHL, Sensation intact distally, 2+ DP pulse with warm and well perfused digits.   Vitals:   06/04/21 2210 06/05/21 0513  BP: 101/81 (!) 143/63  Pulse: (!) 51 (!) 56  Resp: 14 15  Temp: 97.9 F (36.6 C) 97.9 F (36.6 C)  SpO2: 99% 96%     ASSESSMENT: Cheryl Chase is a 65 y.o. female doing well postoperatively. POD#2   PLAN: Weightbearing: WBAT LLE Insicional and dressing care: Aquacel placed today. Reinforce dressings as needed Orthopedic device(s): None Showering: Post-op day #3 VTE prophylaxis: Aspirin 81mg  BID Compression stockings. Ambulation Pain control: PRN pain medications, preferring oral medications Follow - up plan: In office with Dr. on 7/6 Contact information:  Dr. 9/6, Renaye Rakers PA-C, After hours and holidays please check [email or web address] for group call information for [facility]   Dispo: Patient had difficulty with PT after surgery and yesterday due to nausea. Requires additional PT today before safely returning home. If does well with physical therapy today, patient will discharge home with family. TOC following and working on home health PT for the patient. Patient is in agreement with this plan. Patient is ready to go home.    Alfonse Alpers, PA-C 06/05/2021

## 2021-06-05 NOTE — TOC Transition Note (Signed)
Transition of Care [facility]) - CM/SW Discharge Note  Patient Details  Name: Cheryl Chase MRN: [id number] Date of Birth: 1956/01/07  [facility]) CM/SW Contact:  Ewing Schlein, LCSW Phone Number: 06/05/2021, 11:13 AM  Clinical Narrative: Iantha Fallen ([facility]) accepted referral for HHPT. Orders are in. CSW updated Amy with [facility] and the patient. TOC signing off.  Final next level of care: Home w Home Health Services Barriers to Discharge: Barriers Resolved  Patient Goals and CMS Choice Patient states their goals for this hospitalization and ongoing recovery are:: Go home with HHPT CMS Medicare.gov Compare Post Acute Care list provided to:: Patient Choice offered to / list presented to : Patient  Discharge Plan and Services In-house Referral: Clinical Social Work Post Acute Care Choice: Home Health          DME Arranged: N/A DME Agency: NA HH Arranged: PT HH Agency: [facility] Date [facility] Agency Contacted: 06/05/21 Representative spoke with at [facility] Agency: Amy  Readmission Risk Interventions No flowsheet data found.

## 2021-06-05 NOTE — Plan of Care (Signed)

## 2021-06-05 NOTE — Discharge Summary (Signed)
Patient ID: Cheryl Chase MRN: [id number] DOB/AGE: 67/67/1957 67 y.o.  Admit date: 06/03/2021 Discharge date: 06/05/2021  Admission Diagnoses: Left knee osteoarthritis  Discharge Diagnoses:  Active Problems:   Status post left knee replacement   Past Medical History:  Diagnosis Date   Arthritis    Atherosclerosis 12/24/2014   Essential hypertension 12/24/2014   GERD (gastroesophageal reflux disease) 12/24/2014   Hyperlipidemia 12/24/2014   Hypertension    Lumbar disc disease 12/24/2014   Overweight 12/24/2014   Syncope    Procedures Performed: TOTAL KNEE ARTHROPLASTY  Discharged Condition: good  Hospital Course: Patient brought in to [facility] for scheduled procedure. She tolerated procedure well.  She was kept in the hospital for pain control, medical monitoring postop, and physical therapy. She initially had difficulty with physical therapy after surgery due to pain. The following day after surgery she was having a lot of nausea so she was unable to participate in physical therapy. Due to the fact she has stairs to get into her home, she was unsafe for discharge. She was kept an additional midnight for monitoring of symptoms and additional physical therapy. She did well working with physical therapy on post-op day #2. She was found to be stable for DC home on 06/05/2021.  Patient was instructed on specific activity restrictions and all questions were answered.  Consults: PT, TOC  Significant Diagnostic Studies: No additional pertinent studies  Treatments: Surgery  Discharge Exam: PE: General: resting in hospital bed, NAD Cardiac: regular rate Pulmonary: no increased work of breathing GI: abdomen soft, non-tender LLE: dressing CDI, dressing changed, incision CDI, steri strips in place, mild swelling, tender to palpation throughout the knee, compartments soft and compressible, no pain with passive stretch, + GS/TA/EHL, Sensation intact distally, 2+ DP pulse with  warm and well perfused digits.   Disposition: Discharge disposition: 01-Home or Self Care       Discharge Instructions     Call MD for:  redness, tenderness, or signs of infection (pain, swelling, redness, odor or green/yellow discharge around incision site)   Complete by: As directed    Call MD for:  severe uncontrolled pain   Complete by: As directed    Call MD for:  temperature >100.4   Complete by: As directed    Diet - low sodium heart healthy   Complete by: As directed    Increase activity slowly   Complete by: As directed       Allergies as of 06/05/2021   No Known Allergies      Medication List     TAKE these medications    acetaminophen 500 MG tablet Commonly known as: TYLENOL Take 2 tablets (1,000 mg total) by mouth every 8 (eight) hours for 14 days.   amLODipine 2.5 MG tablet Commonly known as: NORVASC Take 1 tablet (2.5 mg total) by mouth daily.   aspirin 81 MG chewable tablet Commonly known as: Aspirin Childrens Chew 1 tablet (81 mg total) by mouth 2 (two) times daily. For DVT prophylaxis after surgery   atorvastatin 10 MG tablet Commonly known as: LIPITOR Take 1 tablet (10 mg total) by mouth daily.   docusate sodium 100 MG capsule Commonly known as: COLACE Take 100 mg by mouth daily.   gabapentin 100 MG capsule Commonly known as: NEURONTIN Take 1 capsule (100 mg total) by mouth 3 (three) times daily. For pain   Melatonin 10 MG Tabs Take 10 mg by mouth at bedtime. Notes to patient: Resume home regimen  meloxicam 15 MG tablet Commonly known as: MOBIC Take 1 tablet (15 mg total) by mouth daily. For pain / inflammation   methocarbamol 500 MG tablet Commonly known as: Robaxin Take 1 tablet (500 mg total) by mouth every 8 (eight) hours as needed for muscle spasms.   omeprazole 20 MG capsule Commonly known as: PRILOSEC Take 1 capsule (20 mg total) by mouth daily. To gastric protection while taking NSAIDs   ondansetron 4 MG  tablet Commonly known as: Zofran Take 1 tablet (4 mg total) by mouth every 8 (eight) hours as needed for up to 7 days for nausea or vomiting. Notes to patient: Last dose given 06/22 08:53am   oxyCODONE 5 MG immediate release tablet Commonly known as: Oxy IR/ROXICODONE Take 1-2 pills every 6 hrs as needed for severe pain, no more than 6 per day Notes to patient: Taken today (June 21) at 2:00pm. Next dose if needed no sooner than 8pm   rOPINIRole 2 MG tablet Commonly known as: REQUIP Take 2 mg by mouth at bedtime. ONCE DAILY 1 TO 3 HOURS BEFORE BEDTIME FOR RESTLESSNESS   spironolactone 25 MG tablet Commonly known as: ALDACTONE Take 0.5 tablets (12.5 mg total) by mouth daily.   triamcinolone ointment 0.1 % Commonly known as: KENALOG Apply 1 application topically daily as needed (rash). Notes to patient: Resume home regimen        Follow-up Information     Sheral Apley, MD Follow up on 06/18/2021.   Specialty: Orthopedic Surgery Why: For wound re-check Contact information: [address] [place] [address] [phone]                  Alfonse Alpers, Cordelia Poche 06/05/2021

## 2021-06-05 NOTE — Progress Notes (Signed)
Physical Therapy Treatment Patient Details Name: Cheryl Chase MRN: [id number] DOB: 1956-10-16 Today's Date: 06/05/2021    History of Present Illness Patient is 65 y.o. female s/p Lt TKA on 06/03/21 with PMH significant for OA, HTN, HLD, GERD.    PT Comments    POD # 2 Pt feeling much better and eager to go to Sister's home today.  Assisted OOB to amb a limited but functional distance in hallway, practiced stairs then Then returned to room to perform some TE's following HEP handout.  Instructed on proper tech, freq as well as use of ICE.   Addressed all mobility questions, discussed appropriate activity, educated on use of ICE.  Pt ready for D/C to home.   Follow Up Recommendations  Follow surgeon's recommendation for DC plan and follow-up therapies;Home health PT     Equipment Recommendations  None recommended by PT    Recommendations for Other Services       Precautions / Restrictions Precautions Precautions: Fall Precaution Comments: instructed no pillow under knee Restrictions Weight Bearing Restrictions: No LLE Weight Bearing: Weight bearing as tolerated    Mobility  Bed Mobility Overal bed mobility: Needs Assistance Bed Mobility: Supine to Sit           General bed mobility comments: demonstrated and instructed how to use belt to self assist LE    Transfers Overall transfer level: Needs assistance Equipment used: Rolling walker (2 wheeled) Transfers: Sit to/from [employer] Transfers Sit to Stand: Supervision Stand pivot transfers: Supervision;Min guard       General transfer comment: one cue for hand placement and LLE position  Ambulation/Gait Ambulation/Gait assistance: Supervision;Min guard Gait Distance (Feet): 15 Feet Assistive device: Rolling walker (2 wheeled) Gait Pattern/deviations: Step-to pattern Gait velocity: decr   General Gait Details: cues for sequence and RW position.  Tolerated a functional distance.   Stairs Stairs:  Yes Stairs assistance: Supervision;Min guard Stair Management: Two rails;Step to pattern;Forwards Number of Stairs: 4 General stair comments: 50% VC's on proper sequencing and safety.   Wheelchair Mobility    Modified Rankin (Stroke Patients Only)       Balance                                            Cognition Arousal/Alertness: Awake/alert Behavior During Therapy: WFL for tasks assessed/performed Overall Cognitive Status: Within Functional Limits for tasks assessed                                 General Comments: AxO x 3 eager to go home today      Exercises   Total Knee Replacement TE's following HEP handout 10 reps B LE ankle pumps 05 reps towel squeezes 05 reps knee presses 05 reps heel slides  05 reps SAQ's 05 reps SLR's 05 reps ABD Educated on use of gait belt to assist with TE's Followed by ICE    General Comments        Pertinent Vitals/Pain Pain Assessment: 0-10 Pain Score: 2  Pain Location: Lt knee Pain Descriptors / Indicators: Aching;Discomfort;Crying;Sore Pain Intervention(s): Monitored during session;Premedicated before session;Repositioned    Home Living                      Prior Function  PT Goals (current goals can now be found in the care plan section) Progress towards PT goals: Progressing toward goals    Frequency    7X/week      PT Plan Current plan remains appropriate    Co-evaluation              AM-PAC PT "6 Clicks" Mobility   Outcome Measure  Help needed turning from your back to your side while in a flat bed without using bedrails?: A Little Help needed moving from lying on your back to sitting on the side of a flat bed without using bedrails?: A Little Help needed moving to and from a bed to a chair (including a wheelchair)?: A Little Help needed standing up from a chair using your arms (e.g., wheelchair or bedside chair)?: A Little Help needed to  walk in hospital room?: A Little Help needed climbing 3-5 steps with a railing? : A Little 6 Click Score: 18    End of Session Equipment Utilized During Treatment: Gait belt Activity Tolerance: Patient tolerated treatment well Patient left: with call bell/phone within reach;in chair;with chair alarm set Nurse Communication: Mobility status;Patient requests pain meds PT Visit Diagnosis: Muscle weakness (generalized) (M62.81);Difficulty in walking, not elsewhere classified (R26.2);Pain Pain - Right/Left: Left Pain - part of body: Knee     Time: 5361-4431 PT Time Calculation (min) (ACUTE ONLY): 27 min  Charges:  $Gait Training: 8-22 mins $Therapeutic Exercise: 8-22 mins                     Felecia Shelling  PTA Acute  Rehabilitation Services Pager      [phone] Office      [phone]

## 2021-06-05 NOTE — Progress Notes (Signed)
Provided discharge education/instructions, all questions and concerns addressed, Pt not in distress, to discharge home with belongings accompanied by sister. 

## 2021-06-09 ENCOUNTER — Ambulatory Visit: Payer: Medicare Other

## 2021-06-13 ENCOUNTER — Ambulatory Visit: Payer: Medicare Other | Admitting: Physical Therapy

## 2021-06-17 ENCOUNTER — Ambulatory Visit: Payer: Medicare Other

## 2021-06-26 ENCOUNTER — Other Ambulatory Visit: Payer: Self-pay | Admitting: Cardiology

## 2021-06-26 DIAGNOSIS — I10 Essential (primary) hypertension: Secondary | ICD-10-CM

## 2021-06-27 NOTE — Telephone Encounter (Signed)
Rx request sent to pharmacy.  

## 2021-09-19 ENCOUNTER — Other Ambulatory Visit: Payer: Self-pay | Admitting: Cardiology

## 2021-09-19 DIAGNOSIS — I7090 Unspecified atherosclerosis: Secondary | ICD-10-CM

## 2021-09-19 DIAGNOSIS — E785 Hyperlipidemia, unspecified: Secondary | ICD-10-CM

## 2021-09-22 NOTE — Telephone Encounter (Signed)
Ok to fill, thanks 

## 2021-09-24 NOTE — Telephone Encounter (Signed)
New Rx for Atorvastatin 10 mg sent to the requesting pharmacy.

## 2021-10-22 ENCOUNTER — Other Ambulatory Visit (HOSPITAL_COMMUNITY): Payer: Medicare Other

## 2021-10-26 ENCOUNTER — Encounter: Payer: Self-pay | Admitting: Cardiology

## 2021-11-04 ENCOUNTER — Ambulatory Visit: Admit: 2021-11-04 | Payer: Medicare Other | Admitting: Orthopedic Surgery

## 2021-11-04 SURGERY — ARTHROPLASTY, KNEE, TOTAL
Anesthesia: Choice | Site: Knee | Laterality: Right

## 2022-01-30 ENCOUNTER — Other Ambulatory Visit: Payer: Self-pay | Admitting: Cardiology

## 2022-01-30 DIAGNOSIS — I10 Essential (primary) hypertension: Secondary | ICD-10-CM

## 2022-03-04 ENCOUNTER — Other Ambulatory Visit: Payer: Self-pay | Admitting: Family Medicine

## 2022-03-04 DIAGNOSIS — Z1231 Encounter for screening mammogram for malignant neoplasm of breast: Secondary | ICD-10-CM

## 2022-03-30 ENCOUNTER — Ambulatory Visit: Payer: Medicare Other

## 2022-04-08 ENCOUNTER — Ambulatory Visit: Payer: Medicare Other

## 2022-06-03 IMAGING — DX DG KNEE 1-2V*L*
2 series · 2 of 2 positions shown · non-contrast
Comparison: Radiographs 03/05/2016.

CLINICAL DATA: Postop left total knee arthroplasty.

EXAM:
LEFT KNEE - 1-2 VIEW

[knee ap]
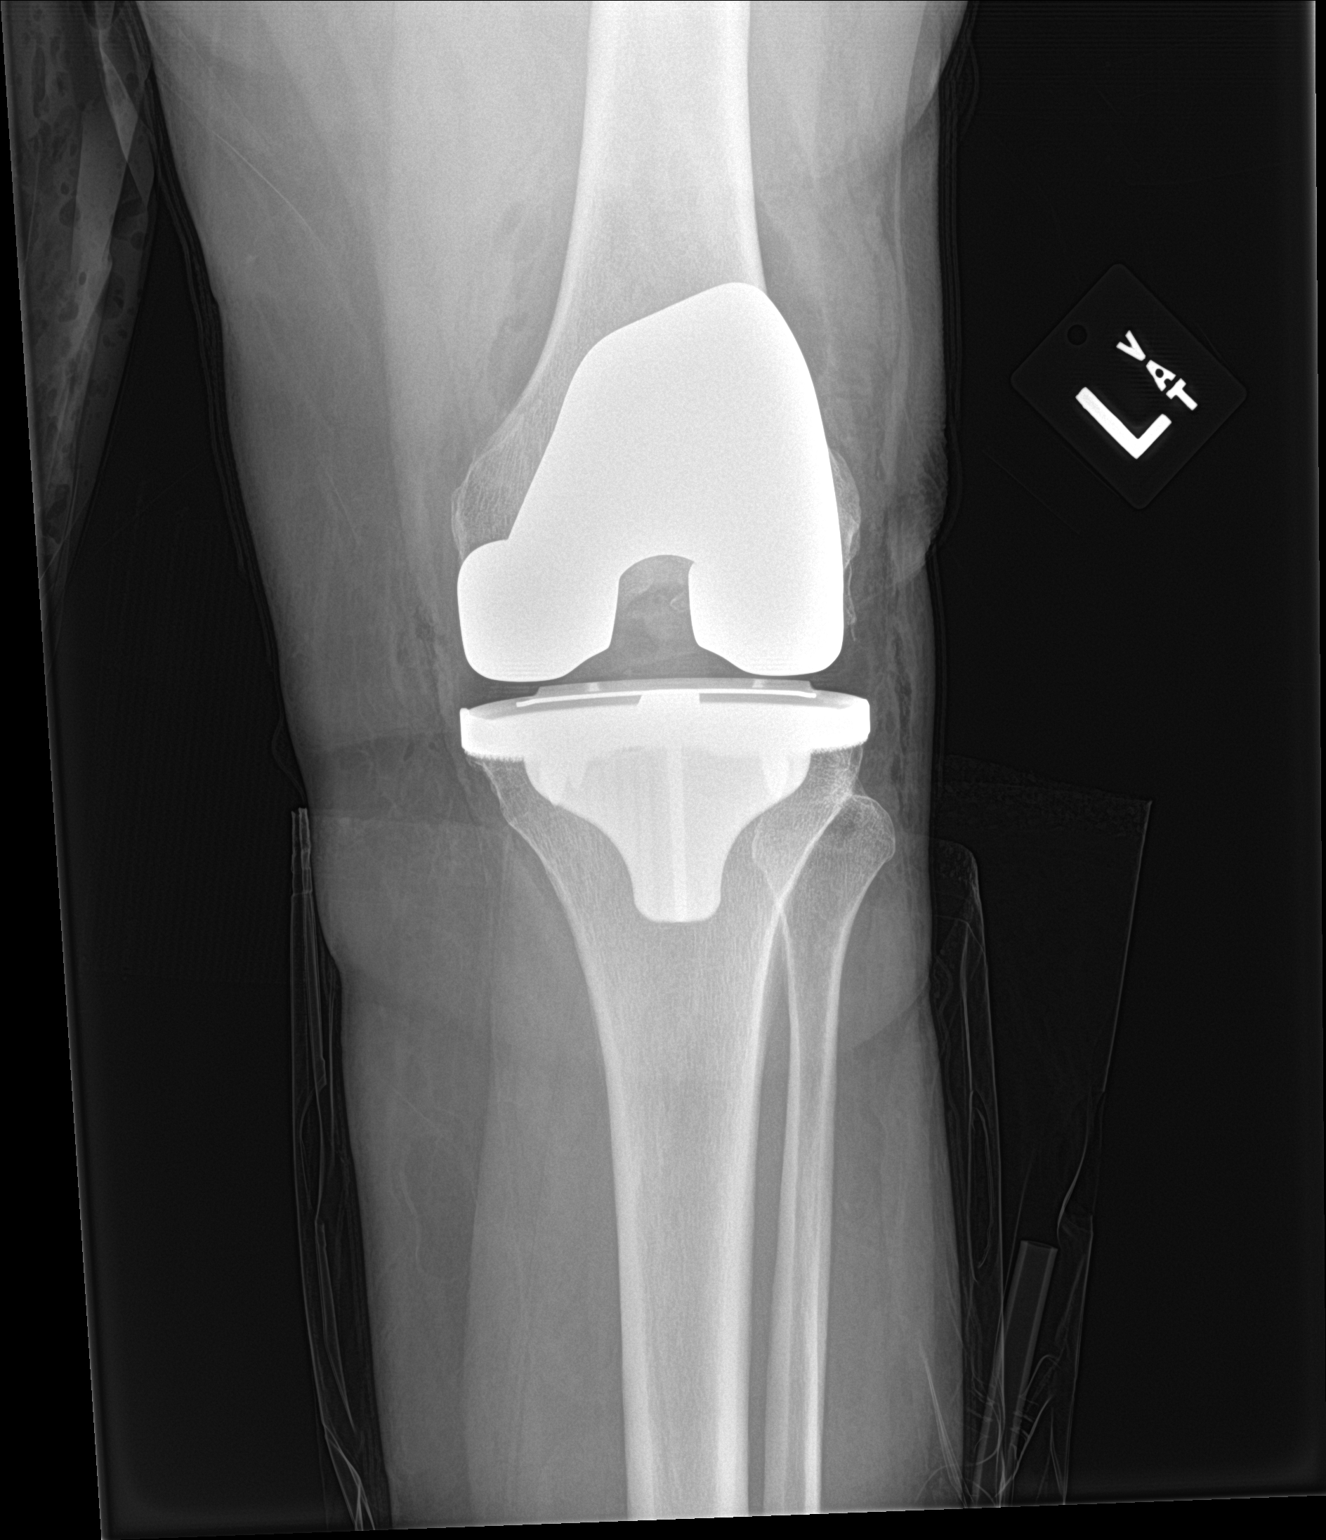

[knee lat]
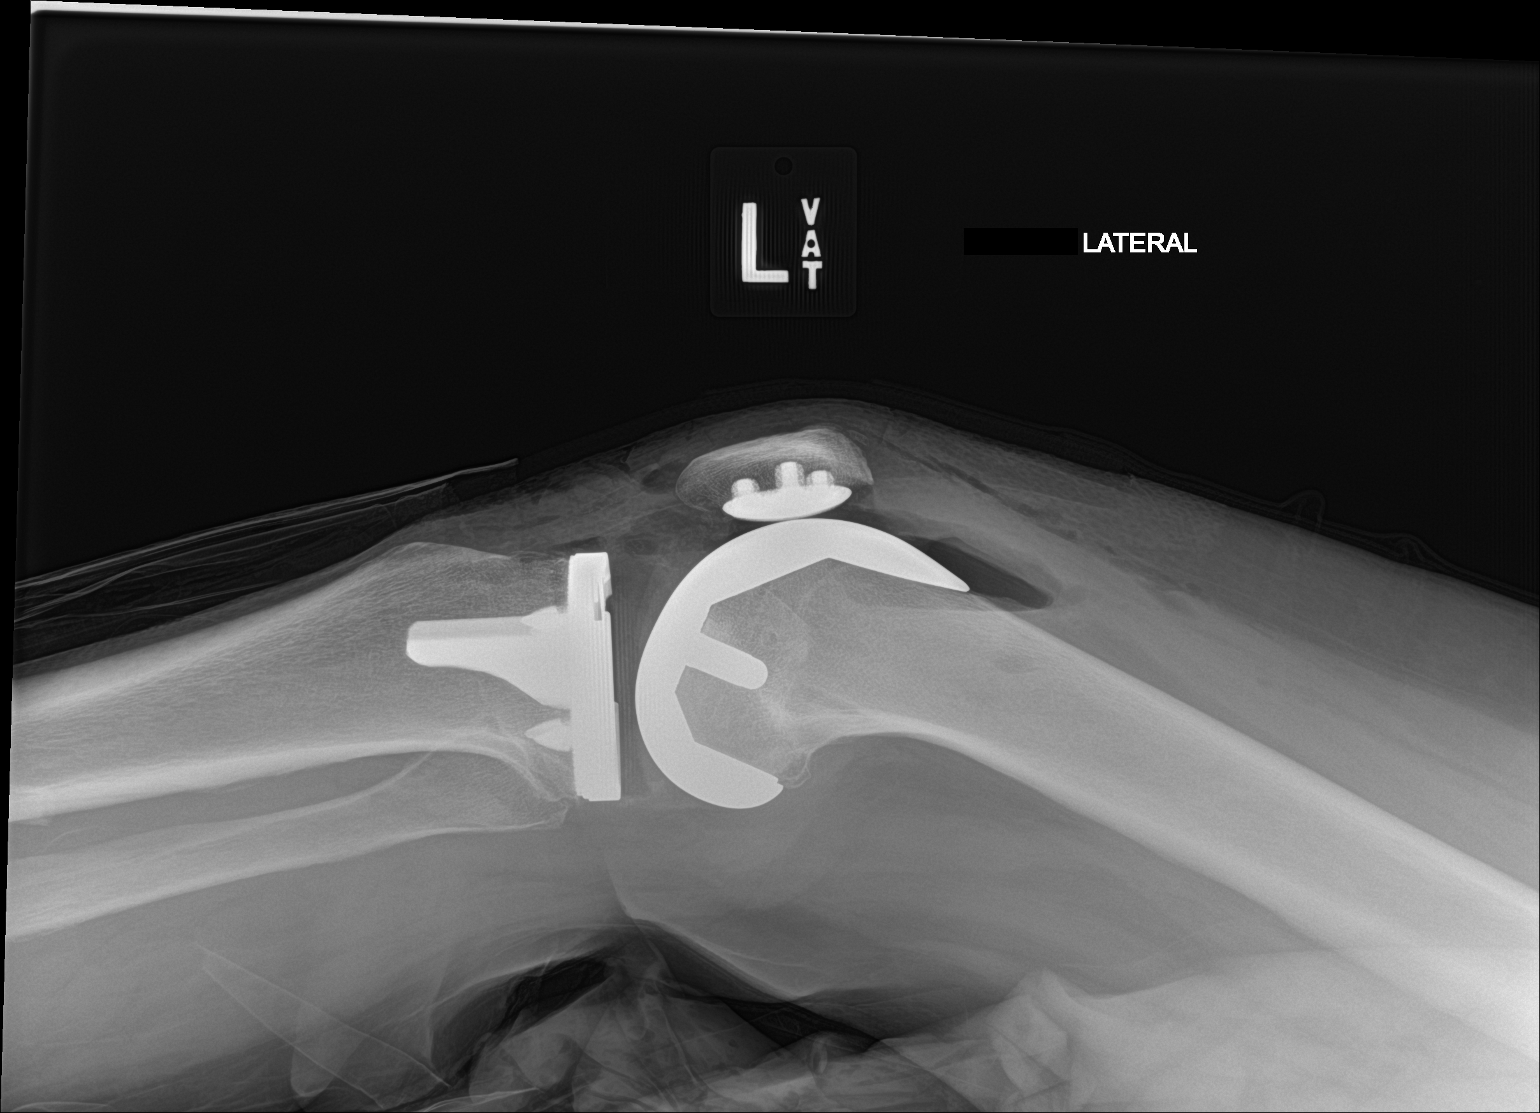

[2 of 2 positions shown; findings below may reference images not displayed]

FINDINGS: Interval left total knee arthroplasty. The hardware is intact. No
evidence of acute fracture or dislocation. There is gas within the
joint and surrounding soft tissues. No unexpected foreign body.
IMPRESSION: No demonstrated complication following left total knee arthroplasty.

## 2022-07-01 ENCOUNTER — Telehealth: Payer: Self-pay | Admitting: Cardiology

## 2022-07-01 NOTE — Telephone Encounter (Signed)
OK by me 

## 2022-07-01 NOTE — Telephone Encounter (Signed)
Patient needs a transfer of care from Dr. Cristal Deer to Dr. Flora Lipps. Please confirm transfer.

## 2022-07-06 ENCOUNTER — Ambulatory Visit (HOSPITAL_BASED_OUTPATIENT_CLINIC_OR_DEPARTMENT_OTHER): Payer: Medicare Other | Admitting: Cardiology

## 2022-10-11 ENCOUNTER — Other Ambulatory Visit: Payer: Self-pay | Admitting: Cardiology

## 2022-10-11 DIAGNOSIS — I7090 Unspecified atherosclerosis: Secondary | ICD-10-CM

## 2022-10-11 DIAGNOSIS — E785 Hyperlipidemia, unspecified: Secondary | ICD-10-CM

## 2022-10-12 NOTE — Progress Notes (Unsigned)
Cardiology Office Note:   Date:  10/14/2022  NAME:  Cheryl Chase    MRN: [id number] DOB:  Oct 06, 1956   PCP:  Kaleen Mask, MD  Cardiologist:  Jodelle Red, MD  Electrophysiologist:  None   Referring MD: Kaleen Mask, *   Chief Complaint  Patient presents with   Follow-up        History of Present Illness:   Cheryl Chase is a 66 y.o. female with a hx of systolic heart failure with recovery of ejection fraction, sinus bradycardia who presents for follow-up.  She denies chest pain or trouble breathing.  She was diagnosed with a cardiomyopathy several years ago.  Left heart catheterization was normal.  Her heart function improved.  Her EKG demonstrates sinus rhythm with PVCs.  She reports no symptoms from this.  She has no symptoms of congestive heart failure.  No trouble breathing.  No limitations.  She can exercise without limitations.  Her blood pressure is well controlled on amlodipine and Aldactone.  Unclear etiology of her heart failure but this could have been blood pressure mediated.  Echocardiogram from last year was normal.  She does use chewing tobacco.  No alcohol or drug use is reported.  She is retired.  She is not married.  She has 3 living children.  She has several grandchildren.  Denies any symptoms in office.  Seems to be stable.  She reports her sister has congestive heart failure.  She also reports several family members who also have this.  Problem List Systolic HF with recovery of EF Bradycardia HTN  Past Medical History: Past Medical History:  Diagnosis Date   Arthritis    Atherosclerosis 12/24/2014   CHF (congestive heart failure) (HCC)    Essential hypertension 12/24/2014   GERD (gastroesophageal reflux disease) 12/24/2014   Hyperlipidemia 12/24/2014   Hypertension    Lumbar disc disease 12/24/2014   Overweight 12/24/2014   Syncope     Past Surgical History: Past Surgical History:  Procedure Laterality Date    ABDOMINAL HYSTERECTOMY     CARDIAC CATHETERIZATION     LEFT HEART CATHETERIZATION WITH CORONARY ANGIOGRAM N/A 12/27/2014   Procedure: LEFT HEART CATHETERIZATION WITH CORONARY ANGIOGRAM;  Surgeon: Othella Boyer, MD;  Location: [facility] CATH LAB;  Service: Cardiovascular;  Laterality: N/A;   TOTAL KNEE ARTHROPLASTY Left 06/03/2021   Procedure: TOTAL KNEE ARTHROPLASTY;  Surgeon: Sheral Apley, MD;  Location: [facility] ORS;  Service: Orthopedics;  Laterality: Left;    Current Medications: Current Meds  Medication Sig   amLODipine (NORVASC) 2.5 MG tablet Take 1 tablet by mouth once daily   atorvastatin (LIPITOR) 10 MG tablet Take 1 tablet (10 mg total) by mouth daily. KEEP APPOINTMENT FOR FUTURE REFILLS   docusate sodium (COLACE) 100 MG capsule Take 100 mg by mouth daily.   gabapentin (NEURONTIN) 100 MG capsule Take 1 capsule (100 mg total) by mouth 3 (three) times daily. For pain   Melatonin 10 MG TABS Take 10 mg by mouth at bedtime.   meloxicam (MOBIC) 15 MG tablet Take 1 tablet (15 mg total) by mouth daily. For pain / inflammation   methocarbamol (ROBAXIN) 500 MG tablet Take 1 tablet (500 mg total) by mouth every 8 (eight) hours as needed for muscle spasms.   rOPINIRole (REQUIP) 2 MG tablet Take 2 mg by mouth at bedtime. ONCE DAILY 1 TO 3 HOURS BEFORE BEDTIME FOR RESTLESSNESS   spironolactone (ALDACTONE) 25 MG tablet Take 0.5 tablets (12.5 mg total) by mouth  daily.   triamcinolone ointment (KENALOG) 0.1 % Apply 1 application topically daily as needed (rash).     Allergies:    Patient has no known allergies.   Social History: Social History   Socioeconomic History   Marital status: Divorced    Spouse name: Not on file   Number of children: 4   Years of education: Not on file   Highest education level: Not on file  Occupational History   Occupation: Retired  Tobacco Use   Smoking status: Never   Smokeless tobacco: Current    Types: Snuff  Vaping Use   Vaping Use: Never used   Substance and Sexual Activity   Alcohol use: Not Currently   Drug use: No   Sexual activity: Not on file  Other Topics Concern   Not on file  Social History Narrative   Not on file   Social Determinants of Health   Financial Resource Strain: Not on file  Food Insecurity: Not on file  Transportation Needs: Not on file  Physical Activity: Not on file  Stress: Not on file  Social Connections: Not on file     Family History: The patient's family history includes Heart disease in her father; Heart failure in her sister.  ROS:   All other ROS reviewed and negative. Pertinent positives noted in the HPI.     EKGs/Labs/Other Studies Reviewed:   The following studies were personally reviewed by me today:  EKG:  EKG is ordered today.  The ekg ordered today demonstrates sinus bradycardia with PVCs, heart rate 56, no acute ischemic changes or evidence of infarction, and was personally reviewed by me.   LHC 12/27/2014 No significant obstructive coronary artery disease.  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 Left ventricular ejection fraction, by estimation, is 55 to 60%. Left  ventricular ejection fraction by 3D volume is 56 %. The left ventricle has  normal function. The left ventricle has no regional wall motion  abnormalities. The left ventricular  internal cavity size was mildly dilated. Left ventricular diastolic  parameters are indeterminate.   2. Right ventricular systolic function is normal. The right ventricular  size is normal. There is normal pulmonary artery systolic pressure. The  estimated right ventricular systolic pressure is 56.3 mmHg.   3. The mitral valve is normal in structure. No evidence of mitral valve  regurgitation.   4. The aortic valve is tricuspid. Aortic valve regurgitation is not  visualized. No aortic stenosis is  present.   5. The inferior vena cava is normal in size with greater than 50%  respiratory variability, suggesting right atrial pressure of 3 mmHg.  Recent Labs: No results found for requested labs within last 365 days.   Recent Lipid Panel    Component Value Date/Time   CHOL 128 03/11/2011 2027   TRIG 85 03/11/2011 2027   HDL 42 03/11/2011 2027   CHOLHDL 3.0 Ratio 03/11/2011 2027   VLDL 17 03/11/2011 2027   LDLCALC 69 03/11/2011 2027    Physical Exam:   VS:  BP 126/77   Pulse (!) 56   Ht 5\' 2"  (1.575 m)   Wt 138 lb 3.2 oz (62.7 kg)   SpO2 98%   BMI 25.28 kg/m    Wt Readings from Last 3 Encounters:  10/14/22 138 lb 3.2 oz (62.7 kg)  06/03/21 175 lb (79.4 kg)  05/21/21 175 lb (79.4 kg)  General: Well nourished, well developed, in no acute distress Head: Atraumatic, normal size  Eyes: PEERLA, EOMI  Neck: Supple, no JVD Endocrine: No thryomegaly Cardiac: Normal S1, S2; RRR; no murmurs, rubs, or gallops Lungs: Clear to auscultation bilaterally, no wheezing, rhonchi or rales  Abd: Soft, nontender, no hepatomegaly  Ext: No edema, pulses 2+ Musculoskeletal: No deformities, BUE and BLE strength normal and equal Skin: Warm and dry, no rashes   Neuro: Alert and oriented to person, place, time, and situation, CNII-XII grossly intact, no focal deficits  Psych: Normal mood and affect   ASSESSMENT:   Cheryl Chase is a 66 y.o. female who presents for the following: 1. History of cardiomyopathy   2. Essential hypertension   3. Bradycardia, sinus     PLAN:   1. History of cardiomyopathy 2. Essential hypertension 3. Bradycardia, sinus -History of systolic dysfunction with improved ejection fraction.  Echo from last year shows EF 55-60%.  She underwent a left heart catheterization in 2016 that was normal.  She endorses no symptoms of congestive heart failure.  She is not on a beta-blocker due to bradycardia.  She is on amlodipine and Aldactone for blood pressure control.   All of her symptoms are quite well controlled.  Her EKG demonstrates PVCs but she denies any symptoms from this.  This does not need treatment or further evaluation.  She had a normal echo last year.  She has no symptoms.  We will continue to see her yearly.      Disposition: Return in about 1 year (around 10/15/2023).  Medication Adjustments/Labs and Tests Ordered: Current medicines are reviewed at length with the patient today.  Concerns regarding medicines are outlined above.  No orders of the defined types were placed in this encounter.  No orders of the defined types were placed in this encounter.   There are no Patient Instructions on file for this visit.   Time Spent with Patient: I have spent a total of 25 minutes with patient reviewing hospital notes, telemetry, EKGs, labs and examining the patient as well as establishing an assessment and plan that was discussed with the patient.  > 50% of time was spent in direct patient care.  Signed, Lenna Gilford. Flora Lipps, MD, [facility]  [facility]  [address], [address] [place], [place] [address] [phone]  10/14/2022 9:22 AM

## 2022-10-12 NOTE — Telephone Encounter (Signed)
Rx(s) sent to pharmacy electronically.  

## 2022-10-14 ENCOUNTER — Encounter: Payer: Self-pay | Admitting: Cardiovascular Disease

## 2022-10-14 ENCOUNTER — Ambulatory Visit: Payer: Medicare Other | Attending: Cardiovascular Disease | Admitting: Cardiovascular Disease

## 2022-10-14 VITALS — BP 126/77 | HR 56 | Ht 62.0 in | Wt 138.2 lb

## 2022-10-14 DIAGNOSIS — I10 Essential (primary) hypertension: Secondary | ICD-10-CM

## 2022-10-14 DIAGNOSIS — R001 Bradycardia, unspecified: Secondary | ICD-10-CM

## 2022-10-14 DIAGNOSIS — Z8679 Personal history of other diseases of the circulatory system: Secondary | ICD-10-CM | POA: Diagnosis not present

## 2022-10-14 NOTE — Patient Instructions (Signed)
Medication Instructions:  The current medical regimen is effective;  continue present plan and medications.  *If you need a refill on your cardiac medications before your next appointment, please call your pharmacy*  Follow-Up: At [facility], you and your health needs are our priority.  As part of our continuing mission to provide you with exceptional heart care, we have created designated Provider Care Teams.  These Care Teams include your primary Cardiologist (physician) and Advanced Practice Providers (APPs -  Physician Assistants and Nurse Practitioners) who all work together to provide you with the care you need, when you need it.  We recommend signing up for the patient portal called "MyChart".  Sign up information is provided on this After Visit Summary.  MyChart is used to connect with patients for Virtual Visits (Telemedicine).  Patients are able to view lab/test results, encounter notes, upcoming appointments, etc.  Non-urgent messages can be sent to your provider as well.   To learn more about what you can do with MyChart, go to [email or web address].    Your next appointment:   12 month(s)  The format for your next appointment:   In Person  Provider:   Callie Goodrich, PA-C or Hao Meng, PA-C       

## 2022-10-15 ENCOUNTER — Telehealth: Payer: Self-pay

## 2022-10-15 NOTE — Telephone Encounter (Signed)
I called patient, advised that she had left keys in the office yesterday 11/02. I made patient aware that we had them here, patient verbalized understanding.   Thankful for call back.

## 2022-11-11 ENCOUNTER — Other Ambulatory Visit: Payer: Self-pay | Admitting: Cardiology

## 2022-11-11 DIAGNOSIS — I7090 Unspecified atherosclerosis: Secondary | ICD-10-CM

## 2022-11-11 DIAGNOSIS — E785 Hyperlipidemia, unspecified: Secondary | ICD-10-CM

## 2022-11-12 NOTE — Telephone Encounter (Signed)
Rx(s) sent to pharmacy electronically.  

## 2023-06-07 ENCOUNTER — Ambulatory Visit (HOSPITAL_COMMUNITY)
Admission: EM | Admit: 2023-06-07 | Discharge: 2023-06-07 | Disposition: A | Discharge status: Home / Self Care | Payer: 59 | Attending: Family Medicine | Admitting: Family Medicine

## 2023-06-07 ENCOUNTER — Encounter (HOSPITAL_COMMUNITY): Payer: Self-pay | Admitting: Emergency Medicine

## 2023-06-07 ENCOUNTER — Other Ambulatory Visit: Payer: Self-pay

## 2023-06-07 DIAGNOSIS — B379 Candidiasis, unspecified: Secondary | ICD-10-CM

## 2023-06-07 DIAGNOSIS — Z9189 Other specified personal risk factors, not elsewhere classified: Secondary | ICD-10-CM

## 2023-06-07 MED ORDER — FLUCONAZOLE 150 MG PO TABS: 150.0000 mg | ORAL_TABLET | ORAL | 0 refills | Status: AC

## 2023-06-07 MED ORDER — DOXYCYCLINE HYCLATE 100 MG PO CAPS: 100.0000 mg | ORAL_CAPSULE | Freq: Two times a day (BID) | ORAL | 0 refills | Status: AC

## 2023-06-07 NOTE — ED Triage Notes (Signed)
Reports a red circle around navel x 1 week.  Patient has found 5 ticks this season.    Patient does have redness around navel.  Patient has noticed an odor to this area.  Odor present today and skin is damp. Patient thinks she feels something in navel. Unclear if tics have played a role in this issue.   Patient has not taken any medications for symptoms

## 2023-06-07 NOTE — ED Provider Notes (Signed)
[facility]    CSN: [id number] Arrival date & time: 06/07/23  1515      History   Chief Complaint Chief Complaint  Patient presents with   Wound Check    HPI Cheryl Chase is a 67 y.o. female.    Wound Check   Here for erythema and irritation and an odor around her umbilicus.  Is been there about a week.  No fever or chills  About 10 days ago she pulled off a tick off her abdomen.  She states she has found several ticks in the last few weeks.   No allergies to medications   Past Medical History:  Diagnosis Date   Arthritis    Atherosclerosis 12/24/2014   CHF (congestive heart failure) (HCC)    Essential hypertension 12/24/2014   GERD (gastroesophageal reflux disease) 12/24/2014   Hyperlipidemia 12/24/2014   Hypertension    Lumbar disc disease 12/24/2014   Overweight 12/24/2014   Syncope     Patient Active Problem List   Diagnosis Date Noted   Status post left knee replacement 06/03/2021   History of syncope 06/20/2019   History of cardiomyopathy 06/20/2019   Abnormal cardiovascular function study 12/27/2014   Abnormal result of cardiovascular function study 12/24/2014   Essential hypertension 12/24/2014   GERD (gastroesophageal reflux disease) 12/24/2014   Chest pain 12/24/2014   Atherosclerosis 12/24/2014   Overweight 12/24/2014   Hyperlipidemia 12/24/2014   Lumbar disc disease 12/24/2014    Past Surgical History:  Procedure Laterality Date   ABDOMINAL HYSTERECTOMY     CARDIAC CATHETERIZATION     LEFT HEART CATHETERIZATION WITH CORONARY ANGIOGRAM N/A 12/27/2014   Procedure: LEFT HEART CATHETERIZATION WITH CORONARY ANGIOGRAM;  Surgeon: Othella Boyer, MD;  [facility] C[facility] LAB;  Service: Cardiovascular;  Laterality: N/A;   TOTAL KNEE ARTHROPLASTY Left 06/03/2021   Procedure: TOTAL KNEE ARTHROPLASTY;  Surgeon: Sheral Apley, MD;  Location: [facility] ORS;  Service: Orthopedics;  Laterality: Left;    OB History   No obstetric  history on file.      Home Medications    Prior to Admission medications   Medication Sig Start Date End Date Taking? Authorizing Provider  doxycycline (VIBRAMYCIN) 100 MG capsule Take 1 capsule (100 mg total) by mouth 2 (two) times daily for 7 days. 06/07/23 06/14/23 Yes Zenia Resides, MD  fluconazole (DIFLUCAN) 150 MG tablet Take 1 tablet (150 mg total) by mouth every 3 (three) days for 3 doses. 06/07/23 06/14/23 Yes Zenia Resides, MD  amLODipine (NORVASC) 2.5 MG tablet Take 1 tablet by mouth once daily 02/02/22   Jodelle Red, MD  atorvastatin (LIPITOR) 10 MG tablet TAKE 1 TABLET BY MOUTH ONCE DAILY . APPOINTMENT REQUIRED FOR FUTURE REFILLS 11/12/22   Jodelle Red, MD  docusate sodium (COLACE) 100 MG capsule Take 100 mg by mouth daily.    [provider]  gabapentin (NEURONTIN) 100 MG capsule Take 1 capsule (100 mg total) by mouth 3 (three) times daily. For pain Patient not taking: Reported on 06/07/2023 06/04/21   Vernetta Honey, PA-C  Melatonin 10 MG TABS Take 10 mg by mouth at bedtime.    [provider]  meloxicam (MOBIC) 15 MG tablet Take 1 tablet (15 mg total) by mouth daily. For pain / inflammation 06/05/21   McBane, Jerald Kief, PA-C  methocarbamol (ROBAXIN) 500 MG tablet Take 1 tablet (500 mg total) by mouth every 8 (eight) hours as needed for muscle spasms. 06/03/21   Vernetta Honey,  PA-C  rOPINIRole (REQUIP) 2 MG tablet Take 2 mg by mouth at bedtime. ONCE DAILY 1 TO 3 HOURS BEFORE BEDTIME FOR RESTLESSNESS    [provider]  spironolactone (ALDACTONE) 25 MG tablet Take 0.5 tablets (12.5 mg total) by mouth daily. 07/26/20   Jodelle Red, MD  triamcinolone ointment (KENALOG) 0.1 % Apply 1 application topically daily as needed (rash). 05/28/20   [provider]    Family History Family History  Problem Relation Age of Onset   Heart disease Father    Heart failure Sister     Social History Social History    Tobacco Use   Smoking status: Never   Smokeless tobacco: Current    Types: Snuff  Vaping Use   Vaping Use: Never used  Substance Use Topics   Alcohol use: Yes   Drug use: Yes    Types: Marijuana     Allergies   Patient has no known allergies.   Review of Systems Review of Systems   Physical Exam Triage Vital Signs ED Triage Vitals  Enc Vitals Group     BP 06/07/23 1628 130/75     Pulse Rate 06/07/23 1628 (!) 58     Resp 06/07/23 1628 18     Temp 06/07/23 1628 98.4 F (36.9 C)     Temp Source 06/07/23 1628 Oral     SpO2 06/07/23 1628 96 %     Weight --      Height --      Head Circumference --      Peak Flow --      Pain Score 06/07/23 1625 0     Pain Loc --      Pain Edu? --      Excl. in GC? --    No data found.  Updated Vital Signs BP 130/75 (BP Location: Right Arm)   Pulse (!) 58   Temp 98.4 F (36.9 C) (Oral)   Resp 18   SpO2 96%   Visual Acuity Right Eye Distance:   Left Eye Distance:   Bilateral Distance:    Right Eye Near:   Left Eye Near:    Bilateral Near:     Physical Exam Vitals reviewed.  Constitutional:      General: She is not in acute distress.    Appearance: She is not ill-appearing, toxic-appearing or diaphoretic.  Skin:    Coloration: Skin is not jaundiced or pale.     Comments: There is faint erythema that is confluent and surrounds and extends into her umbilicus.  There is no foreign body or insect in the umbilicus.  There is a little bit of white discharge noted in the central umbilicus.  There is no skin induration.  Neurological:     General: No focal deficit present.     Mental Status: She is alert and oriented to person, place, and time.  Psychiatric:        Behavior: Behavior normal.      UC Treatments / Results  Labs (all labs ordered are listed, but only abnormal results are displayed) Labs Reviewed - No data to display  EKG   Radiology No results found.  Procedures Procedures (including critical  care time)  Medications Ordered in UC Medications - No data to display  Initial Impression / Assessment and Plan / UC Course  I have reviewed the triage vital signs and the nursing notes.  Pertinent labs & imaging results that were available during my care of the patient were  reviewed by me and considered in my medical decision making (see chart for details).        I do think most likely this is a candidiasis of her umbilicus.  Diflucan is sent in to treat.  Due to the recent tick exposures and the possibility that this may be erythema migrans, doxycycline is sent in also. Final Clinical Impressions(s) / UC Diagnoses   Final diagnoses:  Candidiasis  Risk of exposure to Lyme disease     Discharge Instructions      Take fluconazole 150 mg--1 tablet every 3 days for 3 doses; this is for probable yeast infection in your bellybutton  Take doxycycline 100 mg --1 capsule 2 times daily for 7 days; this is for tick bite and possible exposure to Lyme disease.       ED Prescriptions     Medication Sig Dispense Auth. Provider   doxycycline (VIBRAMYCIN) 100 MG capsule Take 1 capsule (100 mg total) by mouth 2 (two) times daily for 7 days. 14 capsule Zenia Resides, MD   fluconazole (DIFLUCAN) 150 MG tablet Take 1 tablet (150 mg total) by mouth every 3 (three) days for 3 doses. 3 tablet Glendy Barsanti, Janace Aris, MD      PDMP not reviewed this encounter.   Zenia Resides, MD 06/07/23 [id number]

## 2023-06-07 NOTE — Discharge Instructions (Signed)
Take fluconazole 150 mg--1 tablet every 3 days for 3 doses; this is for probable yeast infection in your bellybutton  Take doxycycline 100 mg --1 capsule 2 times daily for 7 days; this is for tick bite and possible exposure to Lyme disease.

## 2023-08-24 ENCOUNTER — Other Ambulatory Visit: Payer: Self-pay | Admitting: Cardiology

## 2023-08-24 DIAGNOSIS — E785 Hyperlipidemia, unspecified: Secondary | ICD-10-CM

## 2023-08-24 DIAGNOSIS — I7090 Unspecified atherosclerosis: Secondary | ICD-10-CM

## 2023-08-25 NOTE — Telephone Encounter (Signed)
Patient of Dr. O'Neal. Please review for refill. Thank you!  

## 2023-10-11 NOTE — Progress Notes (Unsigned)
  Cardiology Office Note   Date:  10/13/2023  ID:  Chase, Cheryl 1956/11/16, MRN [id number] PCP:  Kaleen Mask, MD [facility] Cardiologist: None  Reason for visit: 1 year follow-up  History of Present Illness    Cheryl Chase is a 67 y.o. female with a hx of systolic heart failure with recovered EF (possibly blood pressure mediated), sinus bradycardia, chewing tobacco, asymptomatic PVCs.  History of normal coronaries in 2016.  She was last seen by Dr. Flora Lipps in 10/2022.  She was exercising without limitations.  Blood pressure controlled.    Today, patient states she is doing well.  She shows me her medication bottles which include spironolactone 25 mg half tablet daily and atorvastatin 10 mg daily.  I do not see amlodipine 2.5 mg, which was previously on her medication list.  She denies chest pain, shortness of breath, PND, orthopnea, lower extremity edema, lightheadedness and syncope.  She continues to use chewing tobacco.  Objective / Physical Exam   EKG today: Sinus bradycardia, heart rate 43, minimal voltage criteria for LVH.  Vital signs:  BP 132/84   Pulse (!) 43   Ht 5\' 5"  (1.651 m)   Wt 105 lb 6.4 oz (47.8 kg)   SpO2 99%   BMI 17.54 kg/m     GEN: No acute distress NECK: No carotid bruits CARDIAC: RRR, no murmurs RESPIRATORY:  Clear to auscultation without rales, wheezing or rhonchi  EXTREMITIES: No edema  Assessment and Plan   History of nonischemic cardiomyopathy with recovered EF -Prior cardiomyopathy possibly due to uncontrolled blood pressure -Normal cors on LHC 2016  -Refilled spironolactone 25 mg half tablet daily.  PVCs Sinus bradycardia -Rate typically in the 40s -Not on AV nodal blocking agents.  Denies significant lightheadedness, no syncope. -No significant palpitations, EKG with sinus bradycardia, no ectopy.  Hypertension, reasonably controlled -Continue spironolactone 12.5 mg daily. -Recommend she keep a blood  pressure log and bring it to her PCP appointment next month. -Goal BP is <130/80.  Recommend DASH diet (high in vegetables, fruits, low-fat dairy products, whole grains, poultry, fish, and nuts and low in sweets, sugar-sweetened beverages, and red meats), salt restriction and increase physical activity. -If her blood pressure is over 130/80 consistently, consider restarting amlodipine 2.5 mg daily.  Disposition - Follow-up in 1 year.   Signed, Cannon Kettle, PA-C  10/13/2023 [facility]

## 2023-10-12 ENCOUNTER — Ambulatory Visit: Payer: 59 | Admitting: Student

## 2023-10-13 ENCOUNTER — Encounter: Payer: Self-pay | Admitting: Physician Assistant

## 2023-10-13 ENCOUNTER — Ambulatory Visit: Payer: 59 | Attending: Student | Admitting: Physician Assistant

## 2023-10-13 VITALS — BP 132/84 | HR 43 | Ht 65.0 in | Wt 105.4 lb

## 2023-10-13 DIAGNOSIS — R001 Bradycardia, unspecified: Secondary | ICD-10-CM | POA: Diagnosis not present

## 2023-10-13 DIAGNOSIS — I10 Essential (primary) hypertension: Secondary | ICD-10-CM

## 2023-10-13 DIAGNOSIS — Z8679 Personal history of other diseases of the circulatory system: Secondary | ICD-10-CM | POA: Diagnosis not present

## 2023-10-13 DIAGNOSIS — I493 Ventricular premature depolarization: Secondary | ICD-10-CM

## 2023-10-13 MED ORDER — SPIRONOLACTONE 25 MG PO TABS: 12.5000 mg | ORAL_TABLET | Freq: Every day | ORAL | 3 refills | Status: DC

## 2023-10-13 NOTE — Patient Instructions (Signed)
Medication Instructions:  No Changes *If you need a refill on your cardiac medications before your next appointment, please call your pharmacy*   Lab Work: No Labs If you have labs (blood work) drawn today and your tests are completely normal, you will receive your results only by: MyChart Message (if you have MyChart) OR A paper copy in the mail If you have any lab test that is abnormal or we need to change your treatment, we will call you to review the results.   Testing/Procedures: No Testing   Follow-Up: At [facility], you and your health needs are our priority.  As part of our continuing mission to provide you with exceptional heart care, we have created designated Provider Care Teams.  These Care Teams include your primary Cardiologist (physician) and Advanced Practice Providers (APPs -  Physician Assistants and Nurse Practitioners) who all work together to provide you with the care you need, when you need it.  We recommend signing up for the patient portal called "MyChart".  Sign up information is provided on this After Visit Summary.  MyChart is used to connect with patients for Virtual Visits (Telemedicine).  Patients are able to view lab/test results, encounter notes, upcoming appointments, etc.  Non-urgent messages can be sent to your provider as well.   To learn more about what you can do with MyChart, go to [email or web address].    Your next appointment:   1 year(s)  Provider:   Gerri Spore T. Flora Lipps, MD   Other Instructions Take Blood Pressure 3 Times Weekly. Take Blood Pressure Log to Primary Care  Appointment.

## 2023-12-15 ENCOUNTER — Other Ambulatory Visit: Payer: Self-pay | Admitting: Cardiovascular Disease

## 2023-12-15 DIAGNOSIS — E785 Hyperlipidemia, unspecified: Secondary | ICD-10-CM

## 2023-12-15 DIAGNOSIS — I7090 Unspecified atherosclerosis: Secondary | ICD-10-CM

## 2024-10-11 ENCOUNTER — Encounter: Payer: Self-pay | Admitting: Neurology

## 2024-10-11 ENCOUNTER — Ambulatory Visit: Admitting: Neurology

## 2024-12-16 ENCOUNTER — Other Ambulatory Visit: Payer: Self-pay | Admitting: Cardiovascular Disease

## 2024-12-16 DIAGNOSIS — I7090 Unspecified atherosclerosis: Secondary | ICD-10-CM

## 2024-12-16 DIAGNOSIS — E785 Hyperlipidemia, unspecified: Secondary | ICD-10-CM

## 2024-12-18 ENCOUNTER — Other Ambulatory Visit: Payer: Self-pay

## 2024-12-18 DIAGNOSIS — Z8679 Personal history of other diseases of the circulatory system: Secondary | ICD-10-CM

## 2024-12-18 DIAGNOSIS — I10 Essential (primary) hypertension: Secondary | ICD-10-CM

## 2024-12-20 MED ORDER — SPIRONOLACTONE 25 MG PO TABS: 12.5000 mg | ORAL_TABLET | Freq: Every day | ORAL | 3 refills | Status: AC
